# Patient Record
Sex: Male | Born: 1985 | Race: Black or African American | Hispanic: No | Marital: Single | Smoking: Former smoker
Health system: Southern US, Community
[De-identification: ages and names within clinical notes are randomized; demographics above are authoritative.]

## PROBLEM LIST (undated history)

## (undated) DIAGNOSIS — G35D Multiple sclerosis, unspecified: Secondary | ICD-10-CM

## (undated) DIAGNOSIS — G35 Multiple sclerosis: Secondary | ICD-10-CM

---

## 2012-07-14 ENCOUNTER — Encounter (HOSPITAL_COMMUNITY): Payer: Self-pay | Admitting: *Deleted

## 2012-07-14 ENCOUNTER — Emergency Department (HOSPITAL_COMMUNITY)
Admission: EM | Admit: 2012-07-14 | Discharge: 2012-07-14 | Disposition: A | Discharge status: Home / Self Care | Payer: Self-pay | Attending: Emergency Medicine | Admitting: Emergency Medicine

## 2012-07-14 DIAGNOSIS — S0100XA Unspecified open wound of scalp, initial encounter: Secondary | ICD-10-CM | POA: Insufficient documentation

## 2012-07-14 DIAGNOSIS — W208XXA Other cause of strike by thrown, projected or falling object, initial encounter: Secondary | ICD-10-CM | POA: Insufficient documentation

## 2012-07-14 DIAGNOSIS — S0101XA Laceration without foreign body of scalp, initial encounter: Secondary | ICD-10-CM

## 2012-07-14 DIAGNOSIS — Y9229 Other specified public building as the place of occurrence of the external cause: Secondary | ICD-10-CM | POA: Insufficient documentation
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
## 2012-07-14 NOTE — ED Notes (Signed)
Patient is alert and oriented x3.  He was given DC instructions and follow up visit instructions.  Patient gave verbal understanding.  He was DC ambulatory under his own power to home.  V/S stable.  He was not showing any signs of distress on DC 
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
## 2012-07-14 NOTE — ED Provider Notes (Signed)
Medical screening examination/treatment/procedure(s) were performed by non-physician practitioner and as supervising physician I was immediately available for consultation/collaboration.   Atheena Spano R Giovannie Scerbo, MD 07/14/12 0455 

## 2012-07-23 ENCOUNTER — Emergency Department (HOSPITAL_COMMUNITY)
Admission: EM | Admit: 2012-07-23 | Discharge: 2012-07-23 | Disposition: A | Discharge status: Home / Self Care | Payer: Self-pay | Attending: Emergency Medicine | Admitting: Emergency Medicine

## 2012-07-23 ENCOUNTER — Encounter (HOSPITAL_COMMUNITY): Payer: Self-pay | Admitting: Emergency Medicine

## 2012-07-23 DIAGNOSIS — B079 Viral wart, unspecified: Secondary | ICD-10-CM

## 2012-07-23 DIAGNOSIS — B078 Other viral warts: Secondary | ICD-10-CM | POA: Insufficient documentation

## 2012-07-23 DIAGNOSIS — F17200 Nicotine dependence, unspecified, uncomplicated: Secondary | ICD-10-CM | POA: Insufficient documentation

## 2012-07-23 DIAGNOSIS — Z4802 Encounter for removal of sutures: Secondary | ICD-10-CM | POA: Insufficient documentation

## 2012-07-23 NOTE — ED Notes (Signed)
Pt denies pain or swelling at staple site on top of head.. Area intact, no drainage noted

## 2012-07-23 NOTE — ED Provider Notes (Signed)
History     CSN: [id number]  Arrival date & time 07/23/12  0921   First MD Initiated Contact with Patient 07/23/12 [phone]      Chief Complaint  Patient presents with  . Suture / Staple Removal    one sutue due to laceration  . Skin Ulcer    l/elbow x 2 years    (Consider location/radiation/quality/duration/timing/severity/associated sxs/prior treatment) Patient is a 26 y.o. male presenting with suture removal. The history is provided by the patient.  Suture / Staple Removal  The sutures were placed 7 to 10 days ago. There has been no treatment since the wound repair.  Pt with laceration to the head 9 days ago. Repaired with a staple. Here for staple removal. No problems with the wound. No swelling, redness, drainage. No other complaints. Pt also complaining of a growth to the left elbow that has been there for "years" and states it is getting larger. Denies pain, redness, drainage out of it. Tried over the counter wart removal with no relief.  History reviewed. No pertinent past medical history.  History reviewed. No pertinent past surgical history.  No family history on file.  History  Substance Use Topics  . Smoking status: Current Everyday Smoker    Types: Cigarettes  . Smokeless tobacco: Not on file  . Alcohol Use: Yes      Review of Systems  Constitutional: Negative for fever and chills.  Respiratory: Negative.   Cardiovascular: Negative.   Gastrointestinal: Negative.   Skin: Positive for wound.    Allergies  Review of patient's allergies indicates no known allergies.  Home Medications  No current outpatient prescriptions on file.  BP 119/48  Pulse 63  Temp 98.2 F (36.8 C) (Oral)  Resp 18  SpO2 99%  Physical Exam  Nursing note and vitals reviewed. Constitutional: He is oriented to person, place, and time. He appears well-developed and well-nourished. No distress.  HENT:  Head: Normocephalic.       Healed laceration to the top left scalp about 1cm   In diamter  Eyes: Conjunctivae are normal. Pupils are equal, round, and reactive to light.  Neck: Neck supple.  Cardiovascular: Normal rate, regular rhythm and normal heart sounds.   Pulmonary/Chest: Effort normal and breath sounds normal. No respiratory distress. He has no wheezes. He has no rales.  Neurological: He is alert and oriented to person, place, and time.  Skin: Skin is warm and dry.       A 1cm, pedunculated skin wart to the left elbow, non tender, does not appear infected    ED Course  Procedures (including critical care time)  Staple removed by a nurse before i saw the pt. Laceration healed with no complications. Wart to the left elbow. Advised to follow up with pcp or dermatology for this cosmetic procedure.   1. Removal of staple   2. Cutaneous wart       MDM          Lottie Mussel, PA 07/23/12 1032

## 2012-07-25 NOTE — ED Provider Notes (Signed)
Medical screening examination/treatment/procedure(s) were performed by non-physician practitioner and as supervising physician I was immediately available for consultation/collaboration.  Maeghan Canny T Hanifa Antonetti, MD 07/25/12 0746 

## 2012-09-21 ENCOUNTER — Encounter (HOSPITAL_COMMUNITY): Payer: Self-pay | Admitting: Emergency Medicine

## 2012-09-21 ENCOUNTER — Emergency Department (HOSPITAL_COMMUNITY): Payer: No Typology Code available for payment source

## 2012-09-21 ENCOUNTER — Emergency Department (HOSPITAL_COMMUNITY)
Admission: EM | Admit: 2012-09-21 | Discharge: 2012-09-21 | Disposition: A | Discharge status: Home / Self Care | Payer: No Typology Code available for payment source | Attending: Emergency Medicine | Admitting: Emergency Medicine

## 2012-09-21 DIAGNOSIS — M7989 Other specified soft tissue disorders: Secondary | ICD-10-CM | POA: Insufficient documentation

## 2012-09-21 DIAGNOSIS — M79642 Pain in left hand: Secondary | ICD-10-CM

## 2012-09-21 DIAGNOSIS — IMO0002 Reserved for concepts with insufficient information to code with codable children: Secondary | ICD-10-CM | POA: Insufficient documentation

## 2012-09-21 DIAGNOSIS — F17200 Nicotine dependence, unspecified, uncomplicated: Secondary | ICD-10-CM | POA: Insufficient documentation

## 2012-09-21 DIAGNOSIS — M79609 Pain in unspecified limb: Secondary | ICD-10-CM | POA: Insufficient documentation

## 2012-09-21 DIAGNOSIS — M25569 Pain in unspecified knee: Secondary | ICD-10-CM

## 2012-09-21 DIAGNOSIS — T148XXA Other injury of unspecified body region, initial encounter: Secondary | ICD-10-CM

## 2012-09-21 NOTE — ED Notes (Signed)
[employer] at the bedside talking with pt.
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
## 2012-09-21 NOTE — ED Notes (Signed)
Ortho paged. 
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
## 2012-09-21 NOTE — Progress Notes (Signed)
Orthopedic Tech Progress Note Patient Details:  Alvin Williams 12-09-1986 [id number]  Ortho Devices Type of Ortho Device: Knee Sleeve Ortho Device/Splint Location: Right knee support Ortho Device/Splint Interventions: Application   [name] 09/21/2012, 9:08 AM

## 2012-09-21 NOTE — ED Provider Notes (Signed)
Medical screening examination/treatment/procedure(s) were performed by non-physician practitioner and as supervising physician I was immediately available for consultation/collaboration.  Jeanne Terrance R. Garvis Downum, MD 09/21/12 1610 
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
## 2012-09-21 NOTE — ED Notes (Signed)
CSI at the bedside 

## 2019-04-28 ENCOUNTER — Other Ambulatory Visit (HOSPITAL_COMMUNITY): Payer: Self-pay | Admitting: Neurology

## 2019-04-28 ENCOUNTER — Other Ambulatory Visit: Payer: Self-pay | Admitting: Neurology

## 2019-04-28 DIAGNOSIS — G35 Multiple sclerosis: Secondary | ICD-10-CM

## 2019-05-05 ENCOUNTER — Other Ambulatory Visit: Payer: Self-pay

## 2019-05-05 ENCOUNTER — Encounter (HOSPITAL_COMMUNITY)
Admission: RE | Admit: 2019-05-05 | Discharge: 2019-05-05 | Disposition: A | Discharge status: Home / Self Care | Payer: PRIVATE HEALTH INSURANCE | Source: Ambulatory Visit | Attending: Neurology | Admitting: Neurology

## 2019-05-05 ENCOUNTER — Encounter (HOSPITAL_COMMUNITY): Payer: Self-pay

## 2019-05-05 DIAGNOSIS — G35 Multiple sclerosis: Secondary | ICD-10-CM | POA: Insufficient documentation

## 2019-05-05 HISTORY — DX: Multiple sclerosis: G35

## 2019-05-05 HISTORY — DX: Multiple sclerosis, unspecified: G35.D

## 2019-05-05 MED ORDER — SODIUM CHLORIDE 0.9 % IV SOLN
Freq: Once | INTRAVENOUS | Status: AC
  Administered 2019-05-05: 09:00:00 via INTRAVENOUS

## 2019-05-05 MED ORDER — SODIUM CHLORIDE 0.9 % IV SOLN
1000.0000 mg | Freq: Once | INTRAVENOUS | Status: AC
  Administered 2019-05-05: 10:00:00 1000 mg via INTRAVENOUS
  Filled 2019-05-05: qty 8

## 2019-05-05 NOTE — Progress Notes (Signed)
First dose of 1 gm solumedrol given per order.  Patient tolerated procedure well.  Denies side effects as of 11:00.  Teaching completed related to medication and side effects.  Written material given.  Spoke to he and his mother via [place].  Verbalized understanding.  IV flushed and clamped for use in the am.

## 2019-05-05 NOTE — Discharge Instructions (Signed)
Methylprednisolone Solution for Injection  What is this medicine?  METHYLPREDNISOLONE (meth ill pred NISS oh lone) is a corticosteroid. It is commonly used to treat inflammation of the skin, joints, lungs, and other organs. Common conditions treated include asthma, allergies, and arthritis. It is also used for other conditions, such as blood disorders and diseases of the adrenal glands.  This medicine may be used for other purposes; ask your health care provider or pharmacist if you have questions.  COMMON BRAND NAME(S): A-Methapred, Solu-Medrol  What should I tell my health care provider before I take this medicine?  They need to know if you have any of these conditions:  -Cushing's syndrome  -eye disease, vision problems  -diabetes  -glaucoma  -heart disease  -high blood pressure  -infection (especially a virus infection such as chickenpox, cold sores, or herpes)  -liver disease  -mental illness  -myasthenia gravis  -osteoporosis  -recently received or scheduled to receive a vaccine  -seizures  -stomach or intestine problems  -thyroid disease  -an unusual or allergic reaction to lactose, methylprednisolone, other medicines, foods, dyes, or preservatives  -pregnant or trying to get pregnant  -breast-feeding  How should I use this medicine?  This medicine is for injection or infusion into a vein. It is also for injection into a muscle. It is given by a health care professional in a hospital or clinic setting.  Talk to your pediatrician regarding the use of this medicine in children. While this drug may be prescribed for selected conditions, precautions do apply.  Overdosage: If you think you have taken too much of this medicine contact a poison control center or emergency room at once.  NOTE: This medicine is only for you. Do not share this medicine with others.  What if I miss a dose?  This does not apply.  What may interact with this medicine?  Do not take this medicine with any of the following  medications:  -alefacept  -echinacea  -iopamidol  -live virus vaccines  -metyrapone  -mifepristone  This medicine may also interact with the following medications:  -amphotericin B  -aspirin and aspirin-like medicines  -certain antibiotics like erythromycin, clarithromycin, troleandomycin  -certain medicines for diabetes  -certain medicines for fungal infection like ketoconazole  -certain medicines for seizures like carbamazepine, phenobarbital, phenytoin  -certain medicines that treat or prevent blood clots like warfarin  -cyclosporine  -digoxin  -diuretics  -male hormones, like estrogens and birth control pills  -isoniazid  -NSAIDS, medicines for pain and inflammation, like ibuprofen or naproxen  -other medicines for myasthenia gravis  -rifampin  -vaccines  This list may not describe all possible interactions. Give your health care provider a list of all the medicines, herbs, non-prescription drugs, or dietary supplements you use. Also tell them if you smoke, drink alcohol, or use illegal drugs. Some items may interact with your medicine.  What should I watch for while using this medicine?  Tell your doctor or healthcare professional if your symptoms do not start to get better or if they get worse. Do not stop taking except on your doctor's advice. You may develop a severe reaction. Your doctor will tell you how much medicine to take.  Your condition will be monitored carefully while you are receiving this medicine.  This medicine may increase your risk of getting an infection. Tell your doctor or health care professional if you are around anyone with measles or chickenpox, or if you develop sores or blisters that do not heal properly.  This   medicine may affect blood sugar levels. If you have diabetes, check with your doctor or health care professional before you change your diet or the dose of your diabetic medicine.  Tell your doctor or health care professional right away if you have any change in your  eyesight.  Using this medicine for a long time may increase your risk of low bone mass. Talk to your doctor about bone health.  What side effects may I notice from receiving this medicine?  Side effects that you should report to your doctor or health care professional as soon as possible:  -allergic reactions like skin rash, itching or hives, swelling of the face, lips, or tongue  -bloody or tarry stools  -changes in vision  -hallucination, loss of contact with reality  -muscle cramps  -muscle pain  -palpitations  -signs and symptoms of high blood sugar such as dizziness; dry mouth; dry skin; fruity breath; nausea; stomach pain; increased hunger or thirst; increased urination  -signs and symptoms of infection like fever or chills; cough; sore throat; pain or trouble passing urine  -trouble passing urine or change in the amount of urine  Side effects that usually do not require medical attention (report to your doctor or health care professional if they continue or are bothersome):  -changes in emotions or mood  -constipation  -diarrhea  -excessive hair growth on the face or body  -headache  -nausea, vomiting  -pain, redness, or irritation at site where injected  -trouble sleeping  -weight gain  This list may not describe all possible side effects. Call your doctor for medical advice about side effects. You may report side effects to FDA at 1-800-FDA-1088.  Where should I keep my medicine?  This drug is given in a hospital or clinic and will not be stored at home.  NOTE: This sheet is a summary. It may not cover all possible information. If you have questions about this medicine, talk to your doctor, pharmacist, or health care provider.   2019 Elsevier/Gold Standard (2016-02-10 16:21:28)

## 2019-05-06 ENCOUNTER — Encounter (HOSPITAL_COMMUNITY): Payer: Self-pay

## 2019-05-06 ENCOUNTER — Encounter (HOSPITAL_COMMUNITY)
Admission: RE | Admit: 2019-05-06 | Discharge: 2019-05-06 | Disposition: A | Discharge status: Home / Self Care | Payer: PRIVATE HEALTH INSURANCE | Source: Ambulatory Visit | Attending: Neurology | Admitting: Neurology

## 2019-05-06 DIAGNOSIS — G35 Multiple sclerosis: Secondary | ICD-10-CM | POA: Diagnosis not present

## 2019-05-06 MED ORDER — SODIUM CHLORIDE 0.9 % IV SOLN
1000.0000 mg | Freq: Once | INTRAVENOUS | Status: AC
  Administered 2019-05-06: 1000 mg via INTRAVENOUS
  Filled 2019-05-06: qty 8

## 2019-05-06 MED ORDER — SODIUM CHLORIDE 0.9 % IV SOLN
Freq: Once | INTRAVENOUS | Status: AC
  Administered 2019-05-06: 09:00:00 via INTRAVENOUS

## 2019-05-06 MED ORDER — SODIUM CHLORIDE 0.9 % IV SOLN
1000.0000 mg | Freq: Once | INTRAVENOUS | Status: AC
  Administered 2019-05-07: 1000 mg via INTRAVENOUS
  Filled 2019-05-06: qty 8

## 2019-05-07 ENCOUNTER — Encounter (HOSPITAL_COMMUNITY)
Admission: RE | Admit: 2019-05-07 | Discharge: 2019-05-07 | Disposition: A | Discharge status: Home / Self Care | Payer: PRIVATE HEALTH INSURANCE | Source: Ambulatory Visit | Attending: Neurology | Admitting: Neurology

## 2019-05-07 ENCOUNTER — Encounter (HOSPITAL_COMMUNITY): Payer: Self-pay

## 2019-05-07 ENCOUNTER — Other Ambulatory Visit: Payer: Self-pay

## 2019-05-07 DIAGNOSIS — G35 Multiple sclerosis: Secondary | ICD-10-CM | POA: Diagnosis not present

## 2019-05-07 MED ORDER — SODIUM CHLORIDE 0.9 % IV SOLN
Freq: Once | INTRAVENOUS | Status: AC
  Administered 2019-05-07: 08:00:00 via INTRAVENOUS

## 2019-05-16 ENCOUNTER — Other Ambulatory Visit: Payer: Self-pay

## 2019-05-16 ENCOUNTER — Ambulatory Visit (HOSPITAL_COMMUNITY)
Admission: RE | Admit: 2019-05-16 | Discharge: 2019-05-16 | Disposition: A | Discharge status: Home / Self Care | Payer: No Typology Code available for payment source | Source: Ambulatory Visit | Attending: Neurology | Admitting: Neurology

## 2019-05-16 DIAGNOSIS — G35 Multiple sclerosis: Secondary | ICD-10-CM | POA: Diagnosis not present

## 2019-05-16 MED ORDER — GADOBUTROL 1 MMOL/ML IV SOLN
7.0000 mL | Freq: Once | INTRAVENOUS | Status: AC | PRN
  Administered 2019-05-16: 7 mL via INTRAVENOUS

## 2019-07-04 ENCOUNTER — Other Ambulatory Visit: Payer: Self-pay

## 2019-07-04 ENCOUNTER — Encounter (HOSPITAL_COMMUNITY)
Admission: RE | Admit: 2019-07-04 | Discharge: 2019-07-04 | Disposition: A | Discharge status: Home / Self Care | Payer: No Typology Code available for payment source | Source: Ambulatory Visit | Attending: Neurology | Admitting: Neurology

## 2019-07-04 ENCOUNTER — Encounter (HOSPITAL_COMMUNITY): Payer: Self-pay

## 2019-07-04 DIAGNOSIS — G35 Multiple sclerosis: Secondary | ICD-10-CM | POA: Insufficient documentation

## 2019-07-04 MED ORDER — SODIUM CHLORIDE 0.9 % IV SOLN
Freq: Once | INTRAVENOUS | Status: AC
  Administered 2019-07-04: 09:00:00 via INTRAVENOUS

## 2019-07-04 MED ORDER — SODIUM CHLORIDE 0.9 % IV SOLN
300.0000 mg | Freq: Once | INTRAVENOUS | Status: AC
  Administered 2019-07-04: 300 mg via INTRAVENOUS
  Filled 2019-07-04: qty 15

## 2019-08-01 ENCOUNTER — Encounter (HOSPITAL_COMMUNITY)
Admission: RE | Admit: 2019-08-01 | Discharge: 2019-08-01 | Disposition: A | Discharge status: Home / Self Care | Payer: No Typology Code available for payment source | Source: Ambulatory Visit | Attending: Neurology | Admitting: Neurology

## 2019-08-01 ENCOUNTER — Other Ambulatory Visit: Payer: Self-pay

## 2019-08-01 ENCOUNTER — Encounter (HOSPITAL_COMMUNITY): Payer: Self-pay

## 2019-08-01 DIAGNOSIS — G35 Multiple sclerosis: Secondary | ICD-10-CM | POA: Diagnosis not present

## 2019-08-01 MED ORDER — SODIUM CHLORIDE 0.9 % IV SOLN
300.0000 mg | INTRAVENOUS | Status: DC
Start: 2019-08-01 — End: 2019-08-02
  Administered 2019-08-01: 300 mg via INTRAVENOUS
  Filled 2019-08-01: qty 15

## 2019-08-01 MED ORDER — SODIUM CHLORIDE 0.9 % IV SOLN
Freq: Once | INTRAVENOUS | Status: AC
  Administered 2019-08-01: 250 mL via INTRAVENOUS

## 2019-08-29 ENCOUNTER — Encounter (HOSPITAL_COMMUNITY)
Admission: RE | Admit: 2019-08-29 | Discharge: 2019-08-29 | Disposition: A | Discharge status: Home / Self Care | Payer: No Typology Code available for payment source | Source: Ambulatory Visit | Attending: Neurology | Admitting: Neurology

## 2019-08-29 ENCOUNTER — Other Ambulatory Visit: Payer: Self-pay

## 2019-08-29 DIAGNOSIS — G35 Multiple sclerosis: Secondary | ICD-10-CM | POA: Insufficient documentation

## 2019-08-29 MED ORDER — SODIUM CHLORIDE 0.9 % IV SOLN
300.0000 mg | Freq: Once | INTRAVENOUS | Status: AC
  Administered 2019-08-29: 12:00:00 300 mg via INTRAVENOUS
  Filled 2019-08-29: qty 15

## 2019-08-29 MED ORDER — SODIUM CHLORIDE 0.9 % IV SOLN
Freq: Once | INTRAVENOUS | Status: AC
  Administered 2019-08-29: 12:00:00 via INTRAVENOUS

## 2019-09-26 ENCOUNTER — Encounter (HOSPITAL_COMMUNITY)
Admission: RE | Admit: 2019-09-26 | Discharge: 2019-09-26 | Disposition: A | Discharge status: Home / Self Care | Payer: No Typology Code available for payment source | Source: Ambulatory Visit | Attending: Neurology | Admitting: Neurology

## 2019-09-26 DIAGNOSIS — G35 Multiple sclerosis: Secondary | ICD-10-CM | POA: Insufficient documentation

## 2019-09-26 MED ORDER — SODIUM CHLORIDE 0.9 % IV SOLN
Freq: Once | INTRAVENOUS | Status: AC
  Administered 2019-09-26: 09:00:00 via INTRAVENOUS

## 2019-09-26 MED ORDER — SODIUM CHLORIDE 0.9 % IV SOLN
300.0000 mg | Freq: Once | INTRAVENOUS | Status: AC
  Administered 2019-09-26: 300 mg via INTRAVENOUS
  Filled 2019-09-26: qty 15

## 2019-10-24 ENCOUNTER — Encounter (HOSPITAL_COMMUNITY): Payer: Self-pay

## 2019-10-24 ENCOUNTER — Encounter (HOSPITAL_COMMUNITY)
Admission: RE | Admit: 2019-10-24 | Discharge: 2019-10-24 | Disposition: A | Discharge status: Home / Self Care | Payer: No Typology Code available for payment source | Source: Ambulatory Visit | Attending: Neurology | Admitting: Neurology

## 2019-10-24 ENCOUNTER — Other Ambulatory Visit: Payer: Self-pay

## 2019-10-24 DIAGNOSIS — G35 Multiple sclerosis: Secondary | ICD-10-CM | POA: Insufficient documentation

## 2019-10-24 MED ORDER — SODIUM CHLORIDE 0.9 % IV SOLN
Freq: Once | INTRAVENOUS | Status: AC
  Administered 2019-10-24: 10:00:00 via INTRAVENOUS

## 2019-10-24 MED ORDER — SODIUM CHLORIDE 0.9 % IV SOLN
300.0000 mg | Freq: Once | INTRAVENOUS | Status: AC
  Administered 2019-10-24: 300 mg via INTRAVENOUS
  Filled 2019-10-24: qty 15

## 2019-11-21 ENCOUNTER — Other Ambulatory Visit: Payer: Self-pay

## 2019-11-21 ENCOUNTER — Encounter (HOSPITAL_COMMUNITY)
Admission: RE | Admit: 2019-11-21 | Discharge: 2019-11-21 | Disposition: A | Discharge status: Home / Self Care | Payer: No Typology Code available for payment source | Source: Ambulatory Visit | Attending: Neurology | Admitting: Neurology

## 2019-11-21 ENCOUNTER — Encounter (HOSPITAL_COMMUNITY): Payer: Self-pay

## 2019-11-21 DIAGNOSIS — G35 Multiple sclerosis: Secondary | ICD-10-CM | POA: Insufficient documentation

## 2019-11-21 MED ORDER — SODIUM CHLORIDE 0.9 % IV SOLN
Freq: Once | INTRAVENOUS | Status: AC
  Administered 2019-11-21: 12:00:00 via INTRAVENOUS

## 2019-11-21 MED ORDER — SODIUM CHLORIDE 0.9 % IV SOLN
300.0000 mg | Freq: Once | INTRAVENOUS | Status: AC
  Administered 2019-11-21: 300 mg via INTRAVENOUS
  Filled 2019-11-21: qty 15

## 2019-12-15 ENCOUNTER — Encounter (HOSPITAL_COMMUNITY): Admission: RE | Admit: 2019-12-15 | Payer: No Typology Code available for payment source | Source: Ambulatory Visit

## 2019-12-22 ENCOUNTER — Encounter (HOSPITAL_COMMUNITY)
Admission: RE | Admit: 2019-12-22 | Discharge: 2019-12-22 | Disposition: A | Discharge status: Home / Self Care | Payer: No Typology Code available for payment source | Source: Ambulatory Visit | Attending: Neurology | Admitting: Neurology

## 2019-12-22 ENCOUNTER — Other Ambulatory Visit: Payer: Self-pay

## 2019-12-22 ENCOUNTER — Encounter (HOSPITAL_COMMUNITY): Payer: Self-pay

## 2019-12-22 DIAGNOSIS — G35 Multiple sclerosis: Secondary | ICD-10-CM | POA: Diagnosis present

## 2019-12-22 MED ORDER — SODIUM CHLORIDE 0.9 % IV SOLN
300.0000 mg | Freq: Once | INTRAVENOUS | Status: AC
  Administered 2019-12-22: 300 mg via INTRAVENOUS
  Filled 2019-12-22: qty 15

## 2019-12-22 MED ORDER — SODIUM CHLORIDE 0.9 % IV SOLN: Freq: Once | INTRAVENOUS | Status: AC

## 2020-01-07 ENCOUNTER — Other Ambulatory Visit: Payer: Self-pay

## 2020-01-07 ENCOUNTER — Ambulatory Visit: Payer: Self-pay | Attending: Internal Medicine

## 2020-01-07 DIAGNOSIS — Z20822 Contact with and (suspected) exposure to covid-19: Secondary | ICD-10-CM | POA: Insufficient documentation

## 2020-01-08 LAB — NOVEL CORONAVIRUS, NAA: SARS-CoV-2, NAA: NOT DETECTED

## 2020-01-20 ENCOUNTER — Encounter (HOSPITAL_COMMUNITY)
Admission: RE | Admit: 2020-01-20 | Discharge: 2020-01-20 | Disposition: A | Discharge status: Home / Self Care | Payer: No Typology Code available for payment source | Source: Ambulatory Visit | Attending: Neurology | Admitting: Neurology

## 2020-01-20 ENCOUNTER — Other Ambulatory Visit: Payer: Self-pay

## 2020-01-20 DIAGNOSIS — G35 Multiple sclerosis: Secondary | ICD-10-CM | POA: Insufficient documentation

## 2020-01-20 MED ORDER — SODIUM CHLORIDE 0.9 % IV SOLN: Freq: Once | INTRAVENOUS | Status: AC

## 2020-01-20 MED ORDER — SODIUM CHLORIDE 0.9 % IV SOLN
300.0000 mg | Freq: Once | INTRAVENOUS | Status: AC
  Administered 2020-01-20: 300 mg via INTRAVENOUS
  Filled 2020-01-20: qty 15

## 2020-02-17 ENCOUNTER — Encounter (HOSPITAL_COMMUNITY): Payer: Self-pay

## 2020-02-17 ENCOUNTER — Encounter (HOSPITAL_COMMUNITY)
Admission: RE | Admit: 2020-02-17 | Discharge: 2020-02-17 | Disposition: A | Discharge status: Home / Self Care | Payer: No Typology Code available for payment source | Source: Ambulatory Visit | Attending: Neurology | Admitting: Neurology

## 2020-02-17 ENCOUNTER — Other Ambulatory Visit: Payer: Self-pay

## 2020-02-17 DIAGNOSIS — G35 Multiple sclerosis: Secondary | ICD-10-CM | POA: Insufficient documentation

## 2020-02-17 MED ORDER — SODIUM CHLORIDE 0.9 % IV SOLN: Freq: Once | INTRAVENOUS | Status: AC

## 2020-02-17 MED ORDER — SODIUM CHLORIDE 0.9 % IV SOLN
300.0000 mg | Freq: Once | INTRAVENOUS | Status: AC
  Administered 2020-02-17: 12:00:00 300 mg via INTRAVENOUS
  Filled 2020-02-17: qty 15

## 2020-03-16 ENCOUNTER — Encounter (HOSPITAL_COMMUNITY)
Admission: RE | Admit: 2020-03-16 | Discharge: 2020-03-16 | Disposition: A | Discharge status: Home / Self Care | Payer: No Typology Code available for payment source | Source: Ambulatory Visit | Attending: Neurology | Admitting: Neurology

## 2020-03-16 ENCOUNTER — Other Ambulatory Visit: Payer: Self-pay

## 2020-03-16 ENCOUNTER — Encounter (HOSPITAL_COMMUNITY): Payer: Self-pay

## 2020-03-16 DIAGNOSIS — G35 Multiple sclerosis: Secondary | ICD-10-CM | POA: Diagnosis not present

## 2020-03-16 MED ORDER — SODIUM CHLORIDE 0.9 % IV SOLN: Freq: Once | INTRAVENOUS | Status: AC

## 2020-03-16 MED ORDER — SODIUM CHLORIDE 0.9 % IV SOLN
300.0000 mg | Freq: Once | INTRAVENOUS | Status: AC
  Administered 2020-03-16: 300 mg via INTRAVENOUS
  Filled 2020-03-16: qty 15

## 2020-04-13 ENCOUNTER — Other Ambulatory Visit: Payer: Self-pay

## 2020-04-13 ENCOUNTER — Encounter (HOSPITAL_COMMUNITY)
Admission: RE | Admit: 2020-04-13 | Discharge: 2020-04-13 | Disposition: A | Discharge status: Home / Self Care | Payer: BC Managed Care – PPO | Source: Ambulatory Visit | Attending: Neurology | Admitting: Neurology

## 2020-04-13 ENCOUNTER — Encounter (HOSPITAL_COMMUNITY): Payer: Self-pay

## 2020-04-13 DIAGNOSIS — G35 Multiple sclerosis: Secondary | ICD-10-CM | POA: Insufficient documentation

## 2020-04-13 MED ORDER — SODIUM CHLORIDE 0.9 % IV SOLN: Freq: Once | INTRAVENOUS | Status: AC

## 2020-04-13 MED ORDER — SODIUM CHLORIDE 0.9 % IV SOLN
300.0000 mg | Freq: Once | INTRAVENOUS | Status: AC
  Administered 2020-04-13: 09:00:00 300 mg via INTRAVENOUS
  Filled 2020-04-13: qty 15

## 2020-04-21 DIAGNOSIS — G8929 Other chronic pain: Secondary | ICD-10-CM | POA: Insufficient documentation

## 2020-04-21 DIAGNOSIS — R2681 Unsteadiness on feet: Secondary | ICD-10-CM | POA: Insufficient documentation

## 2020-05-11 ENCOUNTER — Other Ambulatory Visit: Payer: Self-pay

## 2020-05-11 ENCOUNTER — Encounter (HOSPITAL_COMMUNITY)
Admission: RE | Admit: 2020-05-11 | Discharge: 2020-05-11 | Disposition: A | Discharge status: Home / Self Care | Payer: BC Managed Care – PPO | Source: Ambulatory Visit | Attending: Neurology | Admitting: Neurology

## 2020-05-11 ENCOUNTER — Encounter (HOSPITAL_COMMUNITY): Payer: Self-pay

## 2020-05-11 DIAGNOSIS — G35 Multiple sclerosis: Secondary | ICD-10-CM | POA: Insufficient documentation

## 2020-05-11 MED ORDER — SODIUM CHLORIDE 0.9 % IV SOLN: Freq: Once | INTRAVENOUS | Status: AC

## 2020-05-11 MED ORDER — SODIUM CHLORIDE 0.9 % IV SOLN
300.0000 mg | Freq: Once | INTRAVENOUS | Status: AC
  Administered 2020-05-11: 300 mg via INTRAVENOUS
  Filled 2020-05-11: qty 15

## 2020-06-08 ENCOUNTER — Other Ambulatory Visit: Payer: Self-pay

## 2020-06-08 ENCOUNTER — Encounter (HOSPITAL_COMMUNITY)
Admission: RE | Admit: 2020-06-08 | Discharge: 2020-06-08 | Disposition: A | Discharge status: Home / Self Care | Payer: BC Managed Care – PPO | Source: Ambulatory Visit | Attending: Neurology | Admitting: Neurology

## 2020-06-08 DIAGNOSIS — G35 Multiple sclerosis: Secondary | ICD-10-CM | POA: Insufficient documentation

## 2020-06-08 MED ORDER — SODIUM CHLORIDE 0.9 % IV SOLN: Freq: Once | INTRAVENOUS | Status: AC

## 2020-06-08 MED ORDER — SODIUM CHLORIDE 0.9 % IV SOLN
300.0000 mg | Freq: Once | INTRAVENOUS | Status: AC
  Administered 2020-06-08: 300 mg via INTRAVENOUS
  Filled 2020-06-08: qty 15

## 2020-07-06 ENCOUNTER — Encounter (HOSPITAL_COMMUNITY): Payer: Self-pay

## 2020-07-06 ENCOUNTER — Other Ambulatory Visit: Payer: Self-pay

## 2020-07-06 ENCOUNTER — Encounter (HOSPITAL_COMMUNITY)
Admission: RE | Admit: 2020-07-06 | Discharge: 2020-07-06 | Disposition: A | Discharge status: Home / Self Care | Payer: BC Managed Care – PPO | Source: Ambulatory Visit | Attending: Neurology | Admitting: Neurology

## 2020-07-06 DIAGNOSIS — G35 Multiple sclerosis: Secondary | ICD-10-CM | POA: Diagnosis present

## 2020-07-06 MED ORDER — SODIUM CHLORIDE 0.9 % IV SOLN: Freq: Once | INTRAVENOUS | Status: AC

## 2020-07-06 MED ORDER — SODIUM CHLORIDE 0.9 % IV SOLN
300.0000 mg | Freq: Once | INTRAVENOUS | Status: AC
  Administered 2020-07-06: 300 mg via INTRAVENOUS
  Filled 2020-07-06: qty 15

## 2020-08-03 ENCOUNTER — Encounter (HOSPITAL_COMMUNITY)
Admission: RE | Admit: 2020-08-03 | Discharge: 2020-08-03 | Disposition: A | Discharge status: Home / Self Care | Payer: BC Managed Care – PPO | Source: Ambulatory Visit | Attending: Neurology | Admitting: Neurology

## 2020-08-03 ENCOUNTER — Encounter (HOSPITAL_COMMUNITY): Payer: Self-pay

## 2020-08-03 ENCOUNTER — Other Ambulatory Visit: Payer: Self-pay

## 2020-08-03 DIAGNOSIS — G35 Multiple sclerosis: Secondary | ICD-10-CM | POA: Diagnosis present

## 2020-08-03 MED ORDER — SODIUM CHLORIDE 0.9 % IV SOLN
300.0000 mg | Freq: Once | INTRAVENOUS | Status: AC
  Administered 2020-08-03: 300 mg via INTRAVENOUS
  Filled 2020-08-03: qty 15

## 2020-08-03 MED ORDER — SODIUM CHLORIDE 0.9 % IV SOLN: Freq: Once | INTRAVENOUS | Status: AC

## 2020-08-26 ENCOUNTER — Ambulatory Visit (INDEPENDENT_AMBULATORY_CARE_PROVIDER_SITE_OTHER): Payer: BC Managed Care – PPO | Admitting: Otolaryngology

## 2020-08-31 ENCOUNTER — Other Ambulatory Visit: Payer: Self-pay

## 2020-08-31 ENCOUNTER — Encounter (HOSPITAL_COMMUNITY): Payer: Self-pay

## 2020-08-31 ENCOUNTER — Encounter (HOSPITAL_COMMUNITY)
Admission: RE | Admit: 2020-08-31 | Discharge: 2020-08-31 | Disposition: A | Discharge status: Home / Self Care | Payer: BC Managed Care – PPO | Source: Ambulatory Visit | Attending: Neurology | Admitting: Neurology

## 2020-08-31 DIAGNOSIS — G35 Multiple sclerosis: Secondary | ICD-10-CM | POA: Insufficient documentation

## 2020-08-31 MED ORDER — SODIUM CHLORIDE 0.9 % IV SOLN
300.0000 mg | Freq: Once | INTRAVENOUS | Status: AC
  Administered 2020-08-31: 300 mg via INTRAVENOUS
  Filled 2020-08-31: qty 15

## 2020-08-31 MED ORDER — SODIUM CHLORIDE 0.9 % IV SOLN: Freq: Once | INTRAVENOUS | Status: AC

## 2020-09-28 ENCOUNTER — Encounter (HOSPITAL_COMMUNITY): Admission: RE | Admit: 2020-09-28 | Payer: BC Managed Care – PPO | Source: Ambulatory Visit

## 2020-10-01 ENCOUNTER — Encounter (HOSPITAL_COMMUNITY)
Admission: RE | Admit: 2020-10-01 | Discharge: 2020-10-01 | Disposition: A | Discharge status: Home / Self Care | Payer: BC Managed Care – PPO | Source: Ambulatory Visit | Attending: Neurology | Admitting: Neurology

## 2020-10-01 ENCOUNTER — Other Ambulatory Visit: Payer: Self-pay

## 2020-10-01 ENCOUNTER — Encounter (HOSPITAL_COMMUNITY): Payer: Self-pay

## 2020-10-01 DIAGNOSIS — G35 Multiple sclerosis: Secondary | ICD-10-CM | POA: Diagnosis not present

## 2020-10-01 MED ORDER — SODIUM CHLORIDE 0.9 % IV SOLN: Freq: Once | INTRAVENOUS | Status: AC

## 2020-10-01 MED ORDER — SODIUM CHLORIDE 0.9 % IV SOLN
300.0000 mg | Freq: Once | INTRAVENOUS | Status: AC
  Administered 2020-10-01: 300 mg via INTRAVENOUS
  Filled 2020-10-01: qty 15

## 2020-10-29 ENCOUNTER — Encounter (HOSPITAL_COMMUNITY): Payer: Self-pay

## 2020-10-29 ENCOUNTER — Encounter (HOSPITAL_COMMUNITY)
Admission: RE | Admit: 2020-10-29 | Discharge: 2020-10-29 | Disposition: A | Discharge status: Home / Self Care | Payer: BC Managed Care – PPO | Source: Ambulatory Visit | Attending: Neurology | Admitting: Neurology

## 2020-10-29 ENCOUNTER — Other Ambulatory Visit: Payer: Self-pay

## 2020-10-29 DIAGNOSIS — G35 Multiple sclerosis: Secondary | ICD-10-CM | POA: Diagnosis not present

## 2020-10-29 MED ORDER — SODIUM CHLORIDE 0.9 % IV SOLN
300.0000 mg | Freq: Once | INTRAVENOUS | Status: AC
  Administered 2020-10-29: 300 mg via INTRAVENOUS
  Filled 2020-10-29: qty 15

## 2020-10-29 MED ORDER — SODIUM CHLORIDE 0.9 % IV SOLN: Freq: Once | INTRAVENOUS | Status: AC

## 2020-11-29 ENCOUNTER — Encounter (HOSPITAL_COMMUNITY): Payer: Self-pay

## 2020-11-29 ENCOUNTER — Encounter (HOSPITAL_COMMUNITY)
Admission: RE | Admit: 2020-11-29 | Discharge: 2020-11-29 | Disposition: A | Discharge status: Home / Self Care | Payer: BC Managed Care – PPO | Source: Ambulatory Visit | Attending: Neurology | Admitting: Neurology

## 2020-11-29 ENCOUNTER — Other Ambulatory Visit: Payer: Self-pay

## 2020-11-29 DIAGNOSIS — G35 Multiple sclerosis: Secondary | ICD-10-CM | POA: Insufficient documentation

## 2020-11-29 MED ORDER — SODIUM CHLORIDE 0.9 % IV SOLN: Freq: Once | INTRAVENOUS | Status: AC

## 2020-11-29 MED ORDER — SODIUM CHLORIDE 0.9 % IV SOLN
300.0000 mg | Freq: Once | INTRAVENOUS | Status: AC
  Administered 2020-11-29: 300 mg via INTRAVENOUS
  Filled 2020-11-29: qty 15

## 2020-12-27 ENCOUNTER — Encounter (HOSPITAL_COMMUNITY): Payer: Self-pay

## 2020-12-27 ENCOUNTER — Encounter (HOSPITAL_COMMUNITY)
Admission: RE | Admit: 2020-12-27 | Discharge: 2020-12-27 | Disposition: A | Discharge status: Home / Self Care | Payer: BC Managed Care – PPO | Source: Ambulatory Visit | Attending: Neurology | Admitting: Neurology

## 2020-12-27 ENCOUNTER — Other Ambulatory Visit: Payer: Self-pay

## 2020-12-27 DIAGNOSIS — G35 Multiple sclerosis: Secondary | ICD-10-CM | POA: Diagnosis present

## 2020-12-27 MED ORDER — SODIUM CHLORIDE 0.9 % IV SOLN: Freq: Once | INTRAVENOUS | Status: AC

## 2020-12-27 MED ORDER — SODIUM CHLORIDE 0.9 % IV SOLN
300.0000 mg | Freq: Once | INTRAVENOUS | Status: AC
  Administered 2020-12-27: 300 mg via INTRAVENOUS
  Filled 2020-12-27: qty 15

## 2021-01-13 ENCOUNTER — Other Ambulatory Visit (HOSPITAL_COMMUNITY): Payer: Self-pay | Admitting: Neurology

## 2021-01-13 ENCOUNTER — Other Ambulatory Visit: Payer: Self-pay | Admitting: Neurology

## 2021-01-13 DIAGNOSIS — G35 Multiple sclerosis: Secondary | ICD-10-CM

## 2021-01-24 ENCOUNTER — Encounter (HOSPITAL_COMMUNITY): Admission: RE | Admit: 2021-01-24 | Payer: BC Managed Care – PPO | Source: Ambulatory Visit

## 2021-01-25 ENCOUNTER — Encounter (HOSPITAL_COMMUNITY)
Admission: RE | Admit: 2021-01-25 | Discharge: 2021-01-25 | Disposition: A | Discharge status: Home / Self Care | Payer: BC Managed Care – PPO | Source: Ambulatory Visit | Attending: Neurology | Admitting: Neurology

## 2021-01-25 ENCOUNTER — Encounter (HOSPITAL_COMMUNITY): Payer: Self-pay

## 2021-01-25 ENCOUNTER — Other Ambulatory Visit: Payer: Self-pay

## 2021-01-25 DIAGNOSIS — G35 Multiple sclerosis: Secondary | ICD-10-CM | POA: Diagnosis present

## 2021-01-25 MED ORDER — SODIUM CHLORIDE 0.9 % IV SOLN: Freq: Once | INTRAVENOUS | Status: AC

## 2021-01-25 MED ORDER — SODIUM CHLORIDE 0.9 % IV SOLN
300.0000 mg | Freq: Once | INTRAVENOUS | Status: AC
  Administered 2021-01-25: 300 mg via INTRAVENOUS
  Filled 2021-01-25: qty 15

## 2021-01-27 ENCOUNTER — Ambulatory Visit (HOSPITAL_COMMUNITY): Payer: BC Managed Care – PPO | Attending: Neurology

## 2021-01-27 ENCOUNTER — Encounter (HOSPITAL_COMMUNITY): Payer: Self-pay

## 2021-01-27 ENCOUNTER — Ambulatory Visit (HOSPITAL_COMMUNITY): Payer: BC Managed Care – PPO

## 2021-02-22 ENCOUNTER — Encounter (HOSPITAL_COMMUNITY)
Admission: RE | Admit: 2021-02-22 | Discharge: 2021-02-22 | Disposition: A | Discharge status: Home / Self Care | Payer: BC Managed Care – PPO | Source: Ambulatory Visit | Attending: Neurology | Admitting: Neurology

## 2021-02-22 ENCOUNTER — Encounter (HOSPITAL_COMMUNITY): Payer: Self-pay

## 2021-02-22 ENCOUNTER — Other Ambulatory Visit: Payer: Self-pay

## 2021-02-22 DIAGNOSIS — G35 Multiple sclerosis: Secondary | ICD-10-CM | POA: Insufficient documentation

## 2021-02-22 MED ORDER — SODIUM CHLORIDE 0.9 % IV SOLN
300.0000 mg | Freq: Once | INTRAVENOUS | Status: AC
  Administered 2021-02-22: 300 mg via INTRAVENOUS
  Filled 2021-02-22: qty 15

## 2021-02-22 MED ORDER — SODIUM CHLORIDE 0.9 % IV SOLN: Freq: Once | INTRAVENOUS | Status: AC

## 2021-03-22 ENCOUNTER — Encounter (HOSPITAL_COMMUNITY): Admission: RE | Admit: 2021-03-22 | Payer: BC Managed Care – PPO | Source: Ambulatory Visit

## 2021-03-23 ENCOUNTER — Other Ambulatory Visit: Payer: Self-pay

## 2021-03-23 ENCOUNTER — Encounter (HOSPITAL_COMMUNITY)
Admission: RE | Admit: 2021-03-23 | Discharge: 2021-03-23 | Disposition: A | Discharge status: Home / Self Care | Payer: BC Managed Care – PPO | Source: Ambulatory Visit | Attending: Neurology | Admitting: Neurology

## 2021-03-23 ENCOUNTER — Encounter (HOSPITAL_COMMUNITY): Payer: Self-pay

## 2021-03-23 DIAGNOSIS — G35 Multiple sclerosis: Secondary | ICD-10-CM | POA: Diagnosis present

## 2021-03-23 MED ORDER — SODIUM CHLORIDE 0.9 % IV SOLN
300.0000 mg | Freq: Once | INTRAVENOUS | Status: AC
  Administered 2021-03-23: 300 mg via INTRAVENOUS
  Filled 2021-03-23: qty 15

## 2021-03-23 MED ORDER — SODIUM CHLORIDE 0.9 % IV SOLN: Freq: Once | INTRAVENOUS | Status: AC

## 2021-04-20 ENCOUNTER — Other Ambulatory Visit: Payer: Self-pay

## 2021-04-20 ENCOUNTER — Encounter (HOSPITAL_COMMUNITY)
Admission: RE | Admit: 2021-04-20 | Discharge: 2021-04-20 | Disposition: A | Discharge status: Home / Self Care | Payer: BC Managed Care – PPO | Source: Ambulatory Visit | Attending: Neurology | Admitting: Neurology

## 2021-04-20 DIAGNOSIS — G35 Multiple sclerosis: Secondary | ICD-10-CM | POA: Insufficient documentation

## 2021-04-20 MED ORDER — SODIUM CHLORIDE 0.9 % IV SOLN
300.0000 mg | Freq: Once | INTRAVENOUS | Status: AC
  Administered 2021-04-20: 300 mg via INTRAVENOUS
  Filled 2021-04-20: qty 15

## 2021-04-20 MED ORDER — SODIUM CHLORIDE 0.9 % IV SOLN: INTRAVENOUS | Status: DC

## 2021-05-18 ENCOUNTER — Encounter (HOSPITAL_COMMUNITY)
Admission: RE | Admit: 2021-05-18 | Discharge: 2021-05-18 | Disposition: A | Discharge status: Home / Self Care | Payer: BC Managed Care – PPO | Source: Ambulatory Visit | Attending: Neurology | Admitting: Neurology

## 2021-05-18 ENCOUNTER — Other Ambulatory Visit: Payer: Self-pay

## 2021-05-18 DIAGNOSIS — G35 Multiple sclerosis: Secondary | ICD-10-CM | POA: Insufficient documentation

## 2021-05-18 MED ORDER — SODIUM CHLORIDE 0.9 % IV SOLN
Freq: Once | INTRAVENOUS | Status: AC
  Administered 2021-05-18: 250 mL via INTRAVENOUS

## 2021-05-18 MED ORDER — SODIUM CHLORIDE 0.9 % IV SOLN
300.0000 mg | Freq: Once | INTRAVENOUS | Status: AC
  Administered 2021-05-18: 300 mg via INTRAVENOUS
  Filled 2021-05-18: qty 15

## 2021-06-15 ENCOUNTER — Encounter (HOSPITAL_COMMUNITY)
Admission: RE | Admit: 2021-06-15 | Discharge: 2021-06-15 | Disposition: A | Discharge status: Home / Self Care | Payer: BC Managed Care – PPO | Source: Ambulatory Visit | Attending: Neurology | Admitting: Neurology

## 2021-06-23 ENCOUNTER — Encounter (HOSPITAL_COMMUNITY): Admission: RE | Admit: 2021-06-23 | Payer: BC Managed Care – PPO | Source: Ambulatory Visit

## 2021-06-28 ENCOUNTER — Other Ambulatory Visit: Payer: Self-pay

## 2021-06-28 ENCOUNTER — Encounter (HOSPITAL_COMMUNITY)
Admission: RE | Admit: 2021-06-28 | Discharge: 2021-06-28 | Disposition: A | Discharge status: Home / Self Care | Payer: BC Managed Care – PPO | Source: Ambulatory Visit | Attending: Neurology | Admitting: Neurology

## 2021-06-28 DIAGNOSIS — G35 Multiple sclerosis: Secondary | ICD-10-CM | POA: Diagnosis present

## 2021-06-28 MED ORDER — SODIUM CHLORIDE 0.9 % IV SOLN
300.0000 mg | Freq: Once | INTRAVENOUS | Status: AC
  Administered 2021-06-28: 300 mg via INTRAVENOUS
  Filled 2021-06-28: qty 15

## 2021-06-28 MED ORDER — SODIUM CHLORIDE 0.9 % IV SOLN: INTRAVENOUS | Status: DC

## 2021-07-04 ENCOUNTER — Other Ambulatory Visit: Payer: Self-pay

## 2021-07-04 ENCOUNTER — Emergency Department (HOSPITAL_COMMUNITY)
Admission: EM | Admit: 2021-07-04 | Discharge: 2021-07-04 | Disposition: A | Discharge status: Home / Self Care | Payer: BC Managed Care – PPO | Attending: Emergency Medicine | Admitting: Emergency Medicine

## 2021-07-04 ENCOUNTER — Encounter (HOSPITAL_COMMUNITY): Payer: Self-pay

## 2021-07-04 DIAGNOSIS — W1839XA Other fall on same level, initial encounter: Secondary | ICD-10-CM | POA: Diagnosis not present

## 2021-07-04 DIAGNOSIS — R55 Syncope and collapse: Secondary | ICD-10-CM | POA: Diagnosis not present

## 2021-07-04 DIAGNOSIS — S76302A Unspecified injury of muscle, fascia and tendon of the posterior muscle group at thigh level, left thigh, initial encounter: Secondary | ICD-10-CM | POA: Insufficient documentation

## 2021-07-04 DIAGNOSIS — F17210 Nicotine dependence, cigarettes, uncomplicated: Secondary | ICD-10-CM | POA: Insufficient documentation

## 2021-07-04 MED ORDER — KETOROLAC TROMETHAMINE 60 MG/2ML IM SOLN
60.0000 mg | Freq: Once | INTRAMUSCULAR | Status: AC
  Administered 2021-07-04: 60 mg via INTRAMUSCULAR
  Filled 2021-07-04: qty 2

## 2021-07-04 NOTE — ED Provider Notes (Signed)
[facility] EMERGENCY DEPARTMENT Provider Note   CSN: [id number] Arrival date & time: 07/04/21  0515     History Chief Complaint  Patient presents with   Loss of Consciousness    Alvin Williams is a 35 y.o. male.  Patient states he felt dehydrated with dry mouth and lightheadedness on Saturday.  He had a syncopal/near syncopal episode.  Once he came around his left posterior thigh hurt.  States he thought was related to dehydration and possibly an injury with a fall.  Yesterday his girlfriend massaged it and he rested it with ice.  He states that he had pretty good improvement but still hurt this morning to have milk he go to work so presents here for further evaluation.  Patient states he also hurt his left lateral hip when he fell in his right elbow but does not think these are serious injuries.  No head trauma.  No other associated symptoms.   Loss of Consciousness     Past Medical History:  Diagnosis Date   MS (multiple sclerosis) (HCC)     There are no problems to display for this patient.   History reviewed. No pertinent surgical history.     History reviewed. No pertinent family history.  Social History   Tobacco Use   Smoking status: Every Day    Types: Cigarettes   Smokeless tobacco: Never  Vaping Use   Vaping Use: Never used  Substance Use Topics   Alcohol use: Yes   Drug use: Yes    Frequency: 5.0 times per week    Types: Marijuana    Home Medications Prior to Admission medications   Medication Sig Start Date End Date Taking? Authorizing Provider  gabapentin (NEURONTIN) 300 MG capsule Take 300 mg by mouth 2 (two) times daily.    [provider]  ibuprofen (ADVIL) 600 MG tablet Take 600 mg by mouth every 6 (six) hours as needed for moderate pain.    [provider]  natalizumab (TYSABRI) 300 MG/15ML injection Inject 300 mg into the vein every 30 (thirty) days.    Beryle Beams, MD    Allergies    Patient has no known  allergies.  Review of Systems   Review of Systems  Cardiovascular:  Positive for syncope.  All other systems reviewed and are negative.  Physical Exam Updated Vital Signs BP 113/84 (BP Location: Left Arm)   Pulse 83   Temp 98.4 F (36.9 C) (Oral)   Resp 18   Ht 5\' 10"  (1.778 m)   Wt 72.6 kg   SpO2 98%   BMI 22.96 kg/m   Physical Exam Vitals and nursing note reviewed.  Constitutional:      Appearance: He is well-developed.  HENT:     Head: Normocephalic and atraumatic.     Mouth/Throat:     Mouth: Mucous membranes are moist.     Pharynx: Oropharynx is clear.  Eyes:     Pupils: Pupils are equal, round, and reactive to light.  Cardiovascular:     Rate and Rhythm: Normal rate.  Pulmonary:     Effort: Pulmonary effort is normal. No respiratory distress.  Abdominal:     General: Abdomen is flat. There is no distension.  Musculoskeletal:        General: Tenderness (Over left hamstring.  Also with pain when he pushes his heel into the bed.  Mild edema.  Pulses intact.) present. Normal range of motion.     Cervical back: Normal range of motion.  Skin:    General: Skin is warm and dry.  Neurological:     General: No focal deficit present.     Mental Status: He is alert.    ED Results / Procedures / Treatments   Labs (all labs ordered are listed, but only abnormal results are displayed) Labs Reviewed - No data to display  EKG None  Radiology No results found.  Procedures Procedures   Medications Ordered in ED Medications  ketorolac (TORADOL) injection 60 mg (has no administration in time range)    ED Course  I have reviewed the triage vital signs and the nursing notes.  Pertinent labs & imaging results that were available during my care of the patient were reviewed by me and considered in my medical decision making (see chart for details).    MDM Rules/Calculators/A&P                          Suspect hamstring injury related to the fall versus  dehydration.  Is already had significant provement.  Will offer crutches, Ace wrap and ice.  Ortho follow-up if not improving to evaluate for a more severe strain or possible surgical intervention.  Suspect he will be fine.  Work note provided.  Final Clinical Impression(s) / ED Diagnoses Final diagnoses:  Syncope, unspecified syncope type  Left hamstring injury, initial encounter    Rx / DC Orders ED Discharge Orders     None        Kail Fraley, Barbara Cower, MD 07/04/21 [phone]

## 2021-07-04 NOTE — ED Triage Notes (Addendum)
Pt here form home with cc of left leg pain from a syncopal episode yesterday. Said he was out in the heat, not eating or drinking resulting in him passing out. Landed on his leg. Thinks he just pulled a muscle

## 2021-07-26 ENCOUNTER — Encounter (HOSPITAL_COMMUNITY): Payer: Self-pay

## 2021-07-26 ENCOUNTER — Other Ambulatory Visit: Payer: Self-pay

## 2021-07-26 ENCOUNTER — Encounter (HOSPITAL_COMMUNITY)
Admission: RE | Admit: 2021-07-26 | Discharge: 2021-07-26 | Disposition: A | Discharge status: Home / Self Care | Payer: BC Managed Care – PPO | Source: Ambulatory Visit | Attending: Neurology | Admitting: Neurology

## 2021-07-26 DIAGNOSIS — G35 Multiple sclerosis: Secondary | ICD-10-CM | POA: Diagnosis present

## 2021-07-26 MED ORDER — SODIUM CHLORIDE 0.9 % IV SOLN
300.0000 mg | Freq: Once | INTRAVENOUS | Status: AC
  Administered 2021-07-26: 300 mg via INTRAVENOUS
  Filled 2021-07-26: qty 15

## 2021-07-26 MED ORDER — SODIUM CHLORIDE 0.9 % IV SOLN: INTRAVENOUS | Status: DC

## 2021-08-23 ENCOUNTER — Encounter (HOSPITAL_COMMUNITY)
Admission: RE | Admit: 2021-08-23 | Discharge: 2021-08-23 | Disposition: A | Discharge status: Home / Self Care | Payer: BC Managed Care – PPO | Source: Ambulatory Visit | Attending: Neurology | Admitting: Neurology

## 2021-08-23 DIAGNOSIS — G35 Multiple sclerosis: Secondary | ICD-10-CM | POA: Insufficient documentation

## 2021-08-26 NOTE — Addendum Note (Signed)
Encounter addended by: Ramiro Harvest, RN on: 08/26/2021 9:05 AM  Actions taken: Clinical Note Signed

## 2021-08-26 NOTE — Progress Notes (Signed)
08/26/2021 0841-Received fax from [employer] that they did not receive tysabri check list for appt on 08/23/21. Pt was a no show for that appt. Contacted [employer] and talked with Raynelle Fanning. Informed her that pt was a no show for that day and had not rescheduled his appt. Voiced understanding.

## 2021-09-08 ENCOUNTER — Encounter (HOSPITAL_COMMUNITY)
Admission: RE | Admit: 2021-09-08 | Discharge: 2021-09-08 | Disposition: A | Discharge status: Home / Self Care | Payer: BC Managed Care – PPO | Source: Ambulatory Visit | Attending: Neurology | Admitting: Neurology

## 2021-09-08 ENCOUNTER — Other Ambulatory Visit: Payer: Self-pay

## 2021-09-08 ENCOUNTER — Encounter (HOSPITAL_COMMUNITY): Payer: Self-pay

## 2021-09-08 DIAGNOSIS — G35 Multiple sclerosis: Secondary | ICD-10-CM | POA: Diagnosis not present

## 2021-09-08 MED ORDER — SODIUM CHLORIDE 0.9 % IV SOLN
300.0000 mg | Freq: Once | INTRAVENOUS | Status: AC
  Administered 2021-09-08: 300 mg via INTRAVENOUS
  Filled 2021-09-08: qty 15

## 2021-09-08 MED ORDER — SODIUM CHLORIDE 0.9 % IV SOLN
Freq: Once | INTRAVENOUS | Status: AC
  Administered 2021-09-08: 250 mL via INTRAVENOUS

## 2021-09-20 ENCOUNTER — Encounter (HOSPITAL_COMMUNITY): Payer: BC Managed Care – PPO

## 2021-09-27 ENCOUNTER — Ambulatory Visit (HOSPITAL_COMMUNITY): Payer: BC Managed Care – PPO

## 2021-09-27 ENCOUNTER — Ambulatory Visit (HOSPITAL_COMMUNITY): Payer: BC Managed Care – PPO | Attending: Neurology

## 2021-10-06 ENCOUNTER — Encounter (HOSPITAL_COMMUNITY): Payer: Self-pay

## 2021-10-06 ENCOUNTER — Encounter (HOSPITAL_COMMUNITY)
Admission: RE | Admit: 2021-10-06 | Discharge: 2021-10-06 | Disposition: A | Discharge status: Home / Self Care | Payer: BC Managed Care – PPO | Source: Ambulatory Visit | Attending: Neurology | Admitting: Neurology

## 2021-10-06 ENCOUNTER — Other Ambulatory Visit: Payer: Self-pay

## 2021-10-06 DIAGNOSIS — G35 Multiple sclerosis: Secondary | ICD-10-CM | POA: Insufficient documentation

## 2021-10-06 MED ORDER — SODIUM CHLORIDE 0.9 % IV SOLN
300.0000 mg | Freq: Once | INTRAVENOUS | Status: AC
  Administered 2021-10-06: 300 mg via INTRAVENOUS
  Filled 2021-10-06: qty 15

## 2021-10-06 MED ORDER — SODIUM CHLORIDE 0.9 % IV SOLN: INTRAVENOUS | Status: DC

## 2021-10-20 ENCOUNTER — Ambulatory Visit (HOSPITAL_COMMUNITY)
Admission: RE | Admit: 2021-10-20 | Discharge: 2021-10-20 | Disposition: A | Discharge status: Home / Self Care | Payer: BC Managed Care – PPO | Source: Ambulatory Visit | Attending: Neurology | Admitting: Neurology

## 2021-10-20 ENCOUNTER — Other Ambulatory Visit: Payer: Self-pay

## 2021-10-20 DIAGNOSIS — G35 Multiple sclerosis: Secondary | ICD-10-CM

## 2021-10-20 MED ORDER — GADOBUTROL 1 MMOL/ML IV SOLN
10.0000 mL | Freq: Once | INTRAVENOUS | Status: AC | PRN
  Administered 2021-10-20: 10 mL via INTRAVENOUS

## 2021-11-03 ENCOUNTER — Encounter (HOSPITAL_COMMUNITY): Admission: RE | Admit: 2021-11-03 | Payer: BC Managed Care – PPO | Source: Ambulatory Visit

## 2021-11-17 ENCOUNTER — Other Ambulatory Visit: Payer: Self-pay

## 2021-11-17 ENCOUNTER — Encounter (HOSPITAL_COMMUNITY)
Admission: RE | Admit: 2021-11-17 | Discharge: 2021-11-17 | Disposition: A | Discharge status: Home / Self Care | Payer: BC Managed Care – PPO | Source: Ambulatory Visit | Attending: Neurology | Admitting: Neurology

## 2021-11-17 DIAGNOSIS — G35 Multiple sclerosis: Secondary | ICD-10-CM | POA: Diagnosis not present

## 2021-11-17 MED ORDER — SODIUM CHLORIDE 0.9 % IV SOLN
300.0000 mg | Freq: Once | INTRAVENOUS | Status: AC
  Administered 2021-11-17: 300 mg via INTRAVENOUS
  Filled 2021-11-17: qty 15

## 2021-11-17 MED ORDER — SODIUM CHLORIDE 0.9 % IV SOLN: INTRAVENOUS | Status: DC

## 2021-12-15 ENCOUNTER — Encounter (HOSPITAL_COMMUNITY): Admission: RE | Admit: 2021-12-15 | Payer: BC Managed Care – PPO | Source: Ambulatory Visit

## 2021-12-21 ENCOUNTER — Encounter (HOSPITAL_COMMUNITY): Payer: Self-pay

## 2021-12-21 ENCOUNTER — Encounter (HOSPITAL_COMMUNITY)
Admission: RE | Admit: 2021-12-21 | Discharge: 2021-12-21 | Disposition: A | Discharge status: Home / Self Care | Payer: BC Managed Care – PPO | Source: Ambulatory Visit | Attending: Neurology | Admitting: Neurology

## 2021-12-21 DIAGNOSIS — G35 Multiple sclerosis: Secondary | ICD-10-CM | POA: Diagnosis present

## 2021-12-21 MED ORDER — SODIUM CHLORIDE 0.9 % IV SOLN
300.0000 mg | Freq: Once | INTRAVENOUS | Status: AC
  Administered 2021-12-21: 300 mg via INTRAVENOUS
  Filled 2021-12-21: qty 15

## 2021-12-21 MED ORDER — SODIUM CHLORIDE 0.9 % IV SOLN
INTRAVENOUS | Status: DC
  Administered 2021-12-21: 250 mL via INTRAVENOUS

## 2022-01-18 ENCOUNTER — Encounter (HOSPITAL_COMMUNITY): Payer: BC Managed Care – PPO

## 2022-02-01 ENCOUNTER — Encounter (HOSPITAL_COMMUNITY)
Admission: RE | Admit: 2022-02-01 | Discharge: 2022-02-01 | Disposition: A | Discharge status: Home / Self Care | Payer: Self-pay | Source: Ambulatory Visit | Attending: Neurology | Admitting: Neurology

## 2022-02-01 ENCOUNTER — Encounter (HOSPITAL_COMMUNITY): Payer: Self-pay

## 2022-02-01 DIAGNOSIS — G35 Multiple sclerosis: Secondary | ICD-10-CM | POA: Insufficient documentation

## 2022-02-01 MED ORDER — SODIUM CHLORIDE 0.9 % IV SOLN
300.0000 mg | Freq: Once | INTRAVENOUS | Status: AC
  Administered 2022-02-01: 300 mg via INTRAVENOUS
  Filled 2022-02-01: qty 15

## 2022-02-01 MED ORDER — SODIUM CHLORIDE 0.9 % IV SOLN: Freq: Once | INTRAVENOUS | Status: AC

## 2022-02-01 NOTE — Progress Notes (Signed)
°  February 01, 2022  Patient: Alvin Williams  Date of Birth: [birth year]  Date of Visit: 02/01/2022    To Whom It May Concern:  Alvin Williams was seen and treated at our [facility] on 02/01/2022. Alvin Williams  may return to work on 02/02/22.  Sincerely,   R Aquilla Voiles RN

## 2022-02-15 ENCOUNTER — Other Ambulatory Visit: Payer: Self-pay

## 2022-02-15 ENCOUNTER — Encounter (HOSPITAL_COMMUNITY): Payer: Self-pay | Admitting: Emergency Medicine

## 2022-02-15 ENCOUNTER — Emergency Department (HOSPITAL_COMMUNITY): Payer: Self-pay

## 2022-02-15 ENCOUNTER — Emergency Department (HOSPITAL_COMMUNITY)
Admission: EM | Admit: 2022-02-15 | Discharge: 2022-02-15 | Disposition: A | Discharge status: Home / Self Care | Payer: Self-pay | Attending: Emergency Medicine | Admitting: Emergency Medicine

## 2022-02-15 DIAGNOSIS — R0789 Other chest pain: Secondary | ICD-10-CM

## 2022-02-15 DIAGNOSIS — R072 Precordial pain: Secondary | ICD-10-CM | POA: Insufficient documentation

## 2022-02-15 LAB — BASIC METABOLIC PANEL
Anion gap: 8 (ref 5–15)
BUN: 12 mg/dL (ref 6–20)
CO2: 28 mmol/L (ref 22–32)
Calcium: 9.7 mg/dL (ref 8.9–10.3)
Chloride: 100 mmol/L (ref 98–111)
Creatinine, Ser: 1 mg/dL (ref 0.61–1.24)
GFR, Estimated: >60 mL/min (ref >60)
Glucose, Bld: 76 mg/dL (ref 70–99)
Potassium: 4.2 mmol/L (ref 3.5–5.1)
Sodium: 136 mmol/L (ref 135–145)

## 2022-02-15 LAB — CBC
HCT: 40.9 % (ref 39.0–52.0)
Hemoglobin: 13.5 g/dL (ref 13.0–17.0)
MCH: 31.1 pg (ref 26.0–34.0)
MCHC: 33 g/dL (ref 30.0–36.0)
MCV: 94.2 fL (ref 80.0–100.0)
Platelets: 206 10*3/uL (ref 150–400)
RBC: 4.34 MIL/uL (ref 4.22–5.81)
RDW: 13.6 % (ref 11.5–15.5)
WBC: 6.7 10*3/uL (ref 4.0–10.5)
nRBC: 0.6 % — ABNORMAL HIGH (ref 0.0–0.2)

## 2022-02-15 LAB — APTT: aPTT: 27 seconds (ref 24–36)

## 2022-02-15 LAB — PROTIME-INR
INR: 0.9 (ref 0.8–1.2)
Prothrombin Time: 12.6 seconds (ref 11.4–15.2)

## 2022-02-15 LAB — TROPONIN I (HIGH SENSITIVITY): Troponin I (High Sensitivity): 2 ng/L (ref <18)

## 2022-02-15 LAB — CBG MONITORING, ED: Glucose-Capillary: 98 mg/dL (ref 70–99)

## 2022-02-15 MED ORDER — ASPIRIN 81 MG PO CHEW
324.0000 mg | CHEWABLE_TABLET | Freq: Once | ORAL | Status: AC
  Administered 2022-02-15: 324 mg via ORAL
  Filled 2022-02-15: qty 4

## 2022-02-15 MED ORDER — NAPROXEN 500 MG PO TABS: 500.0000 mg | ORAL_TABLET | Freq: Two times a day (BID) | ORAL | 0 refills | Status: DC

## 2022-02-15 MED ORDER — PANTOPRAZOLE SODIUM 20 MG PO TBEC: 20.0000 mg | DELAYED_RELEASE_TABLET | Freq: Every day | ORAL | 0 refills | Status: DC

## 2022-02-15 NOTE — ED Provider Notes (Signed)
[facility] EMERGENCY DEPARTMENT Provider Note   CSN: [id number] Arrival date & time: 02/15/22  0854     History  Chief Complaint  Patient presents with   Chest Pain    Alvin Williams is a 36 y.o. male.   Chest Pain  This patient is a 36 year old male, his primary medical history includes multiple sclerosis which she has had for several years and is currently undergoing infusions.  He reports that he has no history of cardiac disease nor is there a family history of cardiac disease at an early age.  In fact the patient exercises regularly several times a week, lots of strength training, very little cardio secondary to the pain that he has in his legs from his multiple sclerosis.  He states that overnight for approximately 6 hours he has had pain in his chest, it is in the middle of the chest over the sternum and seems to get worse with changes in position such as trying to sit up or get out of bed.  It has not improved to any significant degree and it is not associated with any shortness of breath fever or swelling of the legs.  He denies any radiation of this pain to the arms the shoulders the neck or the back and he has no associated nausea vomiting or diarrhea.  He denies drugs of abuse other than marijuana which she smokes multiple times per week  Home Medications Prior to Admission medications   Medication Sig Start Date End Date Taking? Authorizing Provider  naproxen (NAPROSYN) 500 MG tablet Take 1 tablet (500 mg total) by mouth 2 (two) times daily with a meal. 02/15/22  Yes Eber Hong, MD  pantoprazole (PROTONIX) 20 MG tablet Take 1 tablet (20 mg total) by mouth daily. 02/15/22 03/17/22 Yes Eber Hong, MD  gabapentin (NEURONTIN) 300 MG capsule Take 300 mg by mouth 2 (two) times daily.    [provider]  ibuprofen (ADVIL) 600 MG tablet Take 600 mg by mouth every 6 (six) hours as needed for moderate pain.    [provider]  natalizumab (TYSABRI) 300 MG/15ML  injection Inject 300 mg into the vein every 30 (thirty) days.    Beryle Beams, MD  Vitamin D, Ergocalciferol, (DRISDOL) 1.25 MG (50000 UNIT) CAPS capsule Take 50,000 Units by mouth every 7 (seven) days.    [provider]      Allergies    Patient has no known allergies.    Review of Systems   Review of Systems  Cardiovascular:  Positive for chest pain.   Physical Exam Updated Vital Signs BP (!) 94/57    Pulse 69    Temp 97.9 F (36.6 C) (Oral)    Resp 16    Ht 1.778 m (5\' 10" )    Wt 71.7 kg    SpO2 99%    BMI 22.68 kg/m  Physical Exam Vitals and nursing note reviewed.  Constitutional:      General: He is not in acute distress.    Appearance: He is well-developed.  HENT:     Head: Normocephalic and atraumatic.     Mouth/Throat:     Pharynx: No oropharyngeal exudate.  Eyes:     General: No scleral icterus.       Right eye: No discharge.        Left eye: No discharge.     Conjunctiva/sclera: Conjunctivae normal.     Pupils: Pupils are equal, round, and reactive to light.  Neck:  Thyroid: No thyromegaly.     Vascular: No JVD.  Cardiovascular:     Rate and Rhythm: Normal rate and regular rhythm.     Heart sounds: Normal heart sounds. No murmur heard.   No friction rub. No gallop.  Pulmonary:     Effort: Pulmonary effort is normal. No respiratory distress.     Breath sounds: Normal breath sounds. No wheezing or rales.  Chest:     Chest wall: Tenderness present.  Abdominal:     General: Bowel sounds are normal. There is no distension.     Palpations: Abdomen is soft. There is no mass.     Tenderness: There is no abdominal tenderness.  Musculoskeletal:        General: No tenderness. Normal range of motion.     Cervical back: Normal range of motion and neck supple.     Right lower leg: No edema.     Left lower leg: No edema.  Lymphadenopathy:     Cervical: No cervical adenopathy.  Skin:    General: Skin is warm and dry.     Findings: No erythema or rash.   Neurological:     Mental Status: He is alert.     Coordination: Coordination normal.  Psychiatric:        Behavior: Behavior normal.    ED Results / Procedures / Treatments   Labs (all labs ordered are listed, but only abnormal results are displayed) Labs Reviewed  CBC - Abnormal; Notable for the following components:      Result Value   nRBC 0.6 (*)    All other components within normal limits  BASIC METABOLIC PANEL  APTT  PROTIME-INR  CBG MONITORING, ED  TROPONIN I (HIGH SENSITIVITY)    EKG EKG Interpretation  Date/Time:  Wednesday February 15 2022 09:24:02 EST Ventricular Rate:  74 PR Interval:  213 QRS Duration: 85 QT Interval:  350 QTC Calculation: 389 R Axis:   86 Text Interpretation: Sinus rhythm Prolonged PR interval ST elevation suggests acute pericarditis since last tracing, ST elevations have been seen on prior EKGs Confirmed by Eber Hong ([id number]) on 02/15/2022 9:29:47 AM  Radiology DG Chest 2 View  Result Date: 02/15/2022 CLINICAL DATA:  Chest pain EXAM: CHEST - 2 VIEW COMPARISON:  None. FINDINGS: The heart size and mediastinal contours are within normal limits. Both lungs are clear. No pleural effusion or pneumothorax. The visualized skeletal structures are unremarkable. IMPRESSION: No active cardiopulmonary disease. Electronically Signed   By: Guadlupe Spanish M.D.   On: 02/15/2022 09:43    Procedures Procedures    Medications Ordered in ED Medications  aspirin chewable tablet 324 mg (324 mg Oral Given 02/15/22 0350)    ED Course/ Medical Decision Making/ A&P                           Medical Decision Making Amount and/or Complexity of Data Reviewed Labs: ordered. Radiology: ordered.  Risk OTC drugs.   This patient presents to the ED for concern of chest pain, this involves an extensive number of treatment options, and is a complaint that carries with it a high risk of complications and morbidity.  The differential diagnosis includes cardiac  disease, pneumothorax, pneumomediastinum, pneumopericardium, pulmonary embolism seems less likely, chest wall pain   Co morbidities that complicate the patient evaluation  Multiple sclerosis and chronic pain   Additional history obtained:  Additional history obtained from electronic medical record External records from outside source  obtained and reviewed including the record of the patient's frequent infusions for his multiple sclerosis, there is no prior cardiac testing that has been done   Lab Tests:  I Ordered, and personally interpreted labs.  The pertinent results include:  normal troponin, normal metabolic panel and CBC all unremarkable, normal electrolytes glucose   Imaging Studies ordered:  I ordered imaging studies including 2 view chest x-ray I independently visualized and interpreted imaging which showed no signs of pneumothorax pneumomediastinum, pneumonia or cardiomegaly I agree with the radiologist interpretation   Cardiac Monitoring:  The patient was maintained on a cardiac monitor.  I personally viewed and interpreted the cardiac monitored which showed an underlying rhythm of: Normal sinus rhythm, no ST elevation or arrhythmia   Medicines ordered and prescription drug management:  I ordered medication including aspirin for chest pain Reevaluation of the patient after these medicines showed that the patient improved I have reviewed the patients home medicines and have made adjustments as needed   Test Considered:  CT scan of the chest though it seems much less likely that this would be a pulmonary embolism given the lack of hypoxia shortness of breath tachycardia or swelling of the legs, the patient is not in a prothrombotic state   Critical Interventions:  Evaluation for the cause of chest pain including cardiac causes   Problem List / ED Course:  The patient has had ongoing chest pain throughout the evening and a negative troponin, he is low risk  for acute coronary syndrome, I have counseled the patient on his use of marijuana, the patient is agreeable to return should symptoms worsen.  This could be an early case of pericarditis but there is no signs of pericardial effusion or tamponade clinically, his symptoms are minimal and he has been resting without any distress.  Will refer to cardiology as an outpatient.    Social Determinants of Health:  None           Final Clinical Impression(s) / ED Diagnoses Final diagnoses:  Midsternal chest pain    Rx / DC Orders ED Discharge Orders          Ordered    naproxen (NAPROSYN) 500 MG tablet  2 times daily with meals        02/15/22 1039    pantoprazole (PROTONIX) 20 MG tablet  Daily        02/15/22 1039              Eber Hong, MD 02/15/22 1040

## 2022-02-15 NOTE — Discharge Instructions (Signed)
Your testing today was very reassuring and showed no signs of heart attack, your x-ray was normal.  I would recommend that you stop smoking as this can cause increasing rates of heart disease and problems with your lungs as well.  That being said the pain today may just be related to your chest wall, sometimes the joints of the chest including the ribs and the breastbone or the muscles can be causing the pain.  Occasionally it will be caused by something called pericarditis, I have attached the reading instructions and I would encourage you to read through that.  I also want you to see the cardiologist within the week but if your symptoms are worsening I want you to come back to the emergency department immediately. ?

## 2022-02-15 NOTE — ED Triage Notes (Signed)
Pt to the ED with complaints of central non- radiating chest pain that began at 0300 this morning. ? ?Pt is unable to describe the pain and states "it just hurts" ?

## 2022-02-15 NOTE — ED Notes (Signed)
Pt in gown and on cardiac monitor.

## 2022-03-01 ENCOUNTER — Encounter (HOSPITAL_COMMUNITY)
Admission: RE | Admit: 2022-03-01 | Discharge: 2022-03-01 | Disposition: A | Discharge status: Home / Self Care | Payer: Self-pay | Source: Ambulatory Visit | Attending: Neurology | Admitting: Neurology

## 2022-03-01 ENCOUNTER — Encounter (HOSPITAL_COMMUNITY): Payer: Self-pay

## 2022-03-01 DIAGNOSIS — G35 Multiple sclerosis: Secondary | ICD-10-CM | POA: Insufficient documentation

## 2022-03-01 MED ORDER — SODIUM CHLORIDE 0.9 % IV SOLN: Freq: Once | INTRAVENOUS | Status: AC

## 2022-03-01 MED ORDER — SODIUM CHLORIDE 0.9 % IV SOLN
300.0000 mg | Freq: Once | INTRAVENOUS | Status: AC
  Administered 2022-03-01: 300 mg via INTRAVENOUS
  Filled 2022-03-01: qty 15

## 2022-03-29 ENCOUNTER — Encounter (HOSPITAL_COMMUNITY): Admission: RE | Admit: 2022-03-29 | Payer: Self-pay | Source: Ambulatory Visit

## 2022-03-29 ENCOUNTER — Encounter (HOSPITAL_COMMUNITY): Admission: RE | Admit: 2022-03-29 | Payer: No Typology Code available for payment source | Source: Ambulatory Visit

## 2022-04-04 ENCOUNTER — Encounter (HOSPITAL_COMMUNITY): Payer: Self-pay

## 2022-04-04 ENCOUNTER — Encounter (HOSPITAL_COMMUNITY)
Admission: RE | Admit: 2022-04-04 | Discharge: 2022-04-04 | Disposition: A | Discharge status: Home / Self Care | Payer: No Typology Code available for payment source | Source: Ambulatory Visit | Attending: Neurology | Admitting: Neurology

## 2022-04-04 DIAGNOSIS — G35 Multiple sclerosis: Secondary | ICD-10-CM | POA: Diagnosis present

## 2022-04-04 MED ORDER — SODIUM CHLORIDE 0.9 % IV SOLN: Freq: Once | INTRAVENOUS | Status: AC

## 2022-04-04 MED ORDER — SODIUM CHLORIDE 0.9 % IV SOLN
300.0000 mg | Freq: Once | INTRAVENOUS | Status: AC
  Administered 2022-04-04: 300 mg via INTRAVENOUS
  Filled 2022-04-04: qty 15

## 2022-06-21 DIAGNOSIS — G35 Multiple sclerosis: Secondary | ICD-10-CM | POA: Diagnosis not present

## 2022-06-21 DIAGNOSIS — R200 Anesthesia of skin: Secondary | ICD-10-CM | POA: Diagnosis not present

## 2022-06-21 DIAGNOSIS — Z79899 Other long term (current) drug therapy: Secondary | ICD-10-CM | POA: Diagnosis not present

## 2022-06-21 DIAGNOSIS — R270 Ataxia, unspecified: Secondary | ICD-10-CM | POA: Diagnosis not present

## 2022-06-21 DIAGNOSIS — R201 Hypoesthesia of skin: Secondary | ICD-10-CM | POA: Diagnosis not present

## 2022-07-11 ENCOUNTER — Other Ambulatory Visit: Payer: Self-pay

## 2022-07-11 DIAGNOSIS — R270 Ataxia, unspecified: Secondary | ICD-10-CM | POA: Diagnosis not present

## 2022-07-11 DIAGNOSIS — R200 Anesthesia of skin: Secondary | ICD-10-CM | POA: Diagnosis not present

## 2022-07-11 DIAGNOSIS — G35 Multiple sclerosis: Secondary | ICD-10-CM | POA: Diagnosis not present

## 2022-07-11 DIAGNOSIS — Z79899 Other long term (current) drug therapy: Secondary | ICD-10-CM | POA: Diagnosis not present

## 2022-07-11 DIAGNOSIS — R201 Hypoesthesia of skin: Secondary | ICD-10-CM | POA: Diagnosis not present

## 2022-07-12 ENCOUNTER — Telehealth: Payer: Self-pay | Admitting: Pharmacy Technician

## 2022-07-12 NOTE — Telephone Encounter (Addendum)
Auth Submission: no auth needed Payer: [employer] Medication & CPT/J Code(s) submitted: Solumedrol (Methylprednisolone) J2930 Route of submission (phone, fax, portal): PHONE Auth type: Buy/Bill Units/visits requested:  Reference number: [id number] Approval from: 07/12/22 to 08/12/22   [employer] [facility] PHONE: [phone] Id: [id number] Bin: [id number] Gr: [id number] PCN: [id number]   NOTE:  patient will receive Tysabri tx @ [facility]

## 2022-07-13 ENCOUNTER — Ambulatory Visit (INDEPENDENT_AMBULATORY_CARE_PROVIDER_SITE_OTHER): Payer: 59 | Admitting: *Deleted

## 2022-07-13 VITALS — BP 96/57 | HR 70 | Temp 98.7°F | Resp 18 | Ht 69.0 in | Wt 150.4 lb

## 2022-07-13 DIAGNOSIS — G35 Multiple sclerosis: Secondary | ICD-10-CM

## 2022-07-13 MED ORDER — SODIUM CHLORIDE 0.9 % IV SOLN
1000.0000 mg | Freq: Once | INTRAVENOUS | Status: AC
  Administered 2022-07-13: 1000 mg via INTRAVENOUS
  Filled 2022-07-13: qty 16

## 2022-07-13 NOTE — Progress Notes (Signed)
Diagnosis: Multiple Sclerosis  Provider:  Chilton Greathouse, MD  Procedure: Infusion  IV Type: Peripheral, IV Location: L Antecubital  Solumedrol (Methylprednisolone), Dose: 1000 mg  Infusion Start Time: 1106 am  Infusion Stop Time: 1206 pm  Post Infusion IV Care: Observation period completed and Peripheral IV Discontinued  Discharge: Condition: Good, Destination: Home . AVS provided to patient.   Performed by:  Forrest Moron, RN

## 2022-07-14 ENCOUNTER — Ambulatory Visit (INDEPENDENT_AMBULATORY_CARE_PROVIDER_SITE_OTHER): Payer: 59

## 2022-07-14 ENCOUNTER — Encounter: Payer: Self-pay | Admitting: Pulmonary Disease

## 2022-07-14 VITALS — BP 108/65 | HR 62 | Temp 98.4°F | Resp 18 | Ht 69.0 in | Wt 149.8 lb

## 2022-07-14 DIAGNOSIS — G35 Multiple sclerosis: Secondary | ICD-10-CM | POA: Diagnosis not present

## 2022-07-14 MED ORDER — SODIUM CHLORIDE 0.9 % IV SOLN
1000.0000 mg | Freq: Once | INTRAVENOUS | Status: AC
  Administered 2022-07-14: 1000 mg via INTRAVENOUS
  Filled 2022-07-14: qty 16

## 2022-07-14 NOTE — Progress Notes (Signed)
Diagnosis: Multiple Sclerosis  Provider:  Chilton Greathouse, MD  Procedure: Infusion  IV Type: Peripheral, IV Location: L Antecubital  Solumedrol (Methylprednisolone), Dose: 1000 mg  Infusion Start Time: 1006  Infusion Stop Time: 1110  Post Infusion IV Care: Peripheral IV Discontinued  Discharge: Condition: Good, Destination: Home . AVS provided to patient.   Performed by:  Adriana Mccallum, RN

## 2022-07-14 NOTE — Patient Instructions (Addendum)
Excuse from Work, [employer], or Physical Activity _____[name]_____________________________________ needs to be excused from: _X___ Work. ____ School. ____ Physical activity. This is effective for the following dates: ___7/27/23-8/1/23___________________________________. He or she may return to work or school, but should avoid physical activity or other activities from now until _______________. Activity restrictions include: ____ Lifting more than _________ lb / kg. ____ Sitting longer than __________ minutes at a time. ____ Standing longer than ________ minutes at a time. ____ Other activities including: ___________________________________________________________________________________ _X___ He or she may return to full physical activity on ___8/1/23_____________. Health care provider name (printed): ___Cathy Alston__________________________________________________ Health care provider (signature): _________________________________________________________ Date: ____7/28/23__________________________ This information is not intended to replace advice given to you by your health care provider. Make sure you discuss any questions you have with your health care provider. Document Revised: 08/24/2021 Document Reviewed: 08/24/2021 Elsevier Patient Education  2023 [employer].

## 2022-07-19 ENCOUNTER — Ambulatory Visit (INDEPENDENT_AMBULATORY_CARE_PROVIDER_SITE_OTHER): Payer: 59

## 2022-07-19 VITALS — BP 103/64 | HR 78 | Temp 98.2°F | Resp 16 | Ht 69.0 in | Wt 147.0 lb

## 2022-07-19 DIAGNOSIS — G35 Multiple sclerosis: Secondary | ICD-10-CM | POA: Diagnosis not present

## 2022-07-19 MED ORDER — SODIUM CHLORIDE 0.9 % IV SOLN
1000.0000 mg | Freq: Once | INTRAVENOUS | Status: AC
  Administered 2022-07-19: 1000 mg via INTRAVENOUS
  Filled 2022-07-19: qty 16

## 2022-07-19 NOTE — Progress Notes (Signed)
Diagnosis: Multiple Sclerosis  Provider:  Chilton Greathouse, MD  Procedure: Infusion  IV Type: Peripheral, IV Location: L Upper Arm  Solumedrol (Methylprednisolone), Dose: 1000 mg  Infusion Start Time: 0855  Infusion Stop Time: 0957  Post Infusion IV Care: Peripheral IV Discontinued  Discharge: Condition: Good, Destination: Home . AVS provided to patient.   Performed by:  Nat Math, RN

## 2022-07-20 ENCOUNTER — Ambulatory Visit: Payer: Self-pay

## 2022-07-24 ENCOUNTER — Telehealth: Payer: Self-pay | Admitting: Pharmacy Technician

## 2022-07-24 NOTE — Telephone Encounter (Signed)
Patient will need to give verbal consent with [employer] program Donnamarie Poag) to complete application process.  Patient is aware that he needs to call and complete process. Medication will not be shipped until patient speaks with quick start program.  Phone: [phone] Medication must be shiiped to [facility]. [facility]. [address] [place], [place] [address] Attn: Serafina Royals

## 2022-08-03 ENCOUNTER — Encounter (HOSPITAL_COMMUNITY)
Admission: RE | Admit: 2022-08-03 | Discharge: 2022-08-03 | Disposition: A | Discharge status: Home / Self Care | Payer: 59 | Source: Ambulatory Visit | Attending: Neurology | Admitting: Neurology

## 2022-08-03 ENCOUNTER — Other Ambulatory Visit: Payer: Self-pay

## 2022-08-03 ENCOUNTER — Encounter (HOSPITAL_COMMUNITY): Payer: Self-pay

## 2022-08-03 ENCOUNTER — Other Ambulatory Visit: Payer: Self-pay | Admitting: Pharmacy Technician

## 2022-08-03 ENCOUNTER — Encounter: Payer: Self-pay | Admitting: Pulmonary Disease

## 2022-08-03 DIAGNOSIS — G35 Multiple sclerosis: Secondary | ICD-10-CM

## 2022-08-03 MED ORDER — SODIUM CHLORIDE 0.9 % IV SOLN
300.0000 mg | INTRAVENOUS | Status: DC
  Administered 2022-08-03: 300 mg via INTRAVENOUS
  Filled 2022-08-03: qty 15

## 2022-08-03 NOTE — Progress Notes (Signed)
Diagnosis: Multiple Sclerosis  Provider:  Beryle Beams MD  Procedure: Infusion  IV Type: Peripheral, IV Location: L Upper Arm  Tysabri (Natalizumab), Dose: 300 mg  Infusion Start Time: 0901  Infusion Stop Time: 1005  Post Infusion IV Care: Patient declined observation and Peripheral IV Discontinued  Discharge: Condition: Good, Destination: Home . AVS provided to patient.   Performed by:  Marin Shutter, RN

## 2022-08-08 NOTE — Telephone Encounter (Signed)
Patient would like to receive treatments at [facility] instead of [facility]. (1st dose was given @ [facility]) Future medication will need to be ordered and delivered to [facility].

## 2022-08-14 ENCOUNTER — Telehealth: Payer: Self-pay | Admitting: Pharmacy Technician

## 2022-08-14 NOTE — Telephone Encounter (Signed)
Received x1 dose from quick start program @ [facility]. Patient will come to [facility] for future doses. Patient has not completed enrollment process for FREE DRUG program. Left v/m for patient to call [employer] and f/u with enrollment process. Next scheduled appt 09/14/22 Awaiting call back. [facility] [phone]

## 2022-08-25 ENCOUNTER — Encounter: Payer: Self-pay | Admitting: Pulmonary Disease

## 2022-08-30 NOTE — Telephone Encounter (Addendum)
F/u:  Patient has been approved for the Tysabri co-pay card.  Id: [id number] Gr: [id number] Bin: [id number] Pcn: [id number] Approval date: 08/31/22 Max amount: 10,000/yr Phone: [phone] Fax: [phone]  Auth Submission: APPROVED Payer: Candise Bowens Medication & CPT/J Code(s) submitted: Tysabri (Natalizumab) S2395 Route of submission (phone, fax, portal):  Phone #[phone] Fax # [phone] Auth type: Buy/Bill Units/visits requested: 300mg  q6wks Reference number: DATE: 09/01/22 - 09/01/23

## 2022-09-08 ENCOUNTER — Encounter: Payer: Self-pay | Admitting: Pulmonary Disease

## 2022-09-13 ENCOUNTER — Other Ambulatory Visit (HOSPITAL_COMMUNITY): Payer: Self-pay | Admitting: Neurology

## 2022-09-13 ENCOUNTER — Other Ambulatory Visit: Payer: Self-pay | Admitting: Neurology

## 2022-09-13 DIAGNOSIS — G35 Multiple sclerosis: Secondary | ICD-10-CM | POA: Diagnosis not present

## 2022-09-13 DIAGNOSIS — R270 Ataxia, unspecified: Secondary | ICD-10-CM | POA: Diagnosis not present

## 2022-09-13 DIAGNOSIS — G43009 Migraine without aura, not intractable, without status migrainosus: Secondary | ICD-10-CM | POA: Diagnosis not present

## 2022-09-13 DIAGNOSIS — R201 Hypoesthesia of skin: Secondary | ICD-10-CM | POA: Diagnosis not present

## 2022-09-14 ENCOUNTER — Encounter: Payer: Self-pay | Admitting: Pulmonary Disease

## 2022-09-14 ENCOUNTER — Ambulatory Visit: Payer: 59

## 2022-09-26 DIAGNOSIS — M25462 Effusion, left knee: Secondary | ICD-10-CM | POA: Diagnosis not present

## 2022-09-26 DIAGNOSIS — M25562 Pain in left knee: Secondary | ICD-10-CM | POA: Diagnosis not present

## 2022-09-26 DIAGNOSIS — M79605 Pain in left leg: Secondary | ICD-10-CM | POA: Diagnosis not present

## 2022-09-28 ENCOUNTER — Ambulatory Visit (HOSPITAL_COMMUNITY): Payer: 59 | Attending: Neurology

## 2022-09-28 ENCOUNTER — Ambulatory Visit (HOSPITAL_COMMUNITY): Payer: 59

## 2022-09-29 ENCOUNTER — Ambulatory Visit (INDEPENDENT_AMBULATORY_CARE_PROVIDER_SITE_OTHER): Payer: 59

## 2022-09-29 VITALS — BP 108/62 | HR 56 | Temp 98.5°F | Resp 18 | Ht 69.0 in | Wt 152.6 lb

## 2022-09-29 DIAGNOSIS — G35 Multiple sclerosis: Secondary | ICD-10-CM

## 2022-09-29 MED ORDER — LORATADINE 10 MG PO TABS
10.0000 mg | ORAL_TABLET | Freq: Once | ORAL | Status: AC
  Administered 2022-09-29: 10 mg via ORAL
  Filled 2022-09-29: qty 1

## 2022-09-29 MED ORDER — ACETAMINOPHEN 325 MG PO TABS
650.0000 mg | ORAL_TABLET | Freq: Once | ORAL | Status: AC
  Administered 2022-09-29: 650 mg via ORAL
  Filled 2022-09-29: qty 2

## 2022-09-29 MED ORDER — SODIUM CHLORIDE 0.9 % IV SOLN
300.0000 mg | Freq: Once | INTRAVENOUS | Status: AC
  Administered 2022-09-29: 300 mg via INTRAVENOUS
  Filled 2022-09-29: qty 15

## 2022-09-29 MED ORDER — SODIUM CHLORIDE 0.9 % IV SOLN: INTRAVENOUS | Status: DC

## 2022-09-29 NOTE — Progress Notes (Signed)
Diagnosis: Multiple Sclerosis  Provider:  Marshell Garfinkel MD  Procedure: Infusion  IV Type: Peripheral, IV Location: L Forearm  Tysabri (Natalizumab), Dose: 300 mg  Infusion Start Time: 8184  Infusion Stop Time: 0375  Post Infusion IV Care: Patient declined observation and Peripheral IV Discontinued  Discharge: Condition: Good, Destination: Home . AVS provided to patient.   Performed by:  Koren Shiver, RN

## 2022-10-10 ENCOUNTER — Ambulatory Visit: Payer: 59 | Admitting: Neurology

## 2022-10-15 NOTE — Progress Notes (Unsigned)
[facility]  PATIENT: Alvin Williams DOB: 14-Jul-1986  REFERRING DOCTOR OR PCP: Beryle Beams MD SOURCE: Patient, notes from Dr. Gerilyn Pilgrim, laboratory reports, MRI reports, MRI images personally reviewed.  _________________________________   HISTORICAL  CHIEF COMPLAINT:  Chief Complaint  Patient presents with   New Patient (Initial Visit)    Pt in room #2 and alone. Pt here today for his tysabri for MS. Pt states he has leg pain today.    HISTORY OF PRESENT ILLNESS:  I had the pleasure seeing your patient, Fateh Kindle, at the [facility] at [facility] for neurologic consultation regarding his multiple sclerosis.  He is a 36 year old man who was diagnosed with MS in 2020.  In 2019 he had an episode of numbness and pain in the legs x 2 weeks.  One month later, he had symptoms in the left hand x 1 month.   A couple months later, while at work he noted vision was blurry when he would turn his head for a few seconds at a time and his vision was jerking.     He was referred to Dr, Gerilyn Pilgrim and then had MRI's performed that were consistent with MS.  Marland Kitchen   He was placed on Tysabri and continues on it.    He has done well with no exacerbation.    He has been doing infusions at [facility].    Last infusion was 2 weeks ago.   By report, he is JCV positive and the Tysabri was stretched to every 6 weeks.  I do not have the titer.  Currently, he notes his gait is slightly off balanced.   He fell a couple months ago in the bathroom.  He as pain in both legs., left > right.  He notes some leg numbness.   He notes some spasticity in leg muscles.  He also notes milder arm pain and numbness   He will sometimes drop items.     Bladder function is fine.    He denies any issues with his vision currently.  He notes reduced memory and focus.  He has some depression and anxiety.   He sleeps only 5 hours most night due to repeated awakenings.     He notes fatigue.  He is  otherwise in good health.   He is not able to work out as much as he used to.    Imaging:  MRI of the brain 10/20/2021 showed multiple T2/FLAIR hyperintense foci in the cerebral hemispheres, predominantly in the periventricular white matter radially oriented to the ventricles.  Foci also noted in the right cerebellar hemisphere, and right posterior pons.    MRI of the cervical spine 10/20/2021 showed T2 hyperintense foci within the spinal cord adjacent to C1-C2, adjacent to C3 centrally and towards the left, adjacent to C3 posterolaterally to the right (small), adjacent to C4-C5 posterolaterally to the right  MRI of the brain 05/16/2019 showed large enhancing lesions in the periventricular white matter of both posterior frontal/parietal lobes and another small T2 hyperintense foci in the periventricular white matter.    MRI of the cervical spine 05/16/2019 shows foci adjacent to C1-C2 and adjacent to C2-C3 (enhancing) and C5.  REVIEW OF SYSTEMS: Constitutional: No fevers, chills, sweats, or change in appetite.  He has fatigue.  He has insomnia. Eyes: No visual changes, double vision, eye pain Ear, nose and throat: No hearing loss, ear pain, nasal congestion, sore throat Cardiovascular: No chest pain, palpitations Respiratory:  No shortness of  breath at rest or with exertion.   No wheezes GastrointestinaI: No nausea, vomiting, diarrhea, abdominal pain, fecal incontinence Genitourinary:  No dysuria, urinary retention or frequency.  No nocturia. Musculoskeletal: No neck pain.  He notes some back pain. Integumentary: No rash, pruritus, skin lesions Neurological: as above Psychiatric: No depression at this time.  No anxiety Endocrine: No palpitations, diaphoresis, change in appetite, change in weigh or increased thirst Hematologic/Lymphatic:  No anemia, purpura, petechiae. Allergic/Immunologic: No itchy/runny eyes, nasal congestion, recent allergic reactions, rashes  ALLERGIES: No Known  Allergies  HOME MEDICATIONS:  Current Outpatient Medications:    ibuprofen (ADVIL) 600 MG tablet, Take 600 mg by mouth every 6 (six) hours as needed for moderate pain., Disp: , Rfl:    naproxen (NAPROSYN) 500 MG tablet, Take 1 tablet (500 mg total) by mouth 2 (two) times daily with a meal., Disp: 30 tablet, Rfl: 0   natalizumab (TYSABRI) 300 MG/15ML injection, Inject 300 mg into the vein every 30 (thirty) days., Disp: , Rfl:    gabapentin (NEURONTIN) 400 MG capsule, Take 1 capsule (400 mg total) by mouth 2 (two) times daily., Disp: 90 capsule, Rfl: 11   pantoprazole (PROTONIX) 20 MG tablet, Take 1 tablet (20 mg total) by mouth daily., Disp: 30 tablet, Rfl: 0   Vitamin D, Ergocalciferol, (DRISDOL) 1.25 MG (50000 UNIT) CAPS capsule, Take 1 capsule (50,000 Units total) by mouth every 7 (seven) days., Disp: 13 capsule, Rfl: 1  PAST MEDICAL HISTORY: Past Medical History:  Diagnosis Date   MS (multiple sclerosis) (HCC)     PAST SURGICAL HISTORY: History reviewed. No pertinent surgical history.  FAMILY HISTORY: History reviewed. No pertinent family history.  SOCIAL HISTORY: Social History   Socioeconomic History   Marital status: Single    Spouse name: Not on file   Number of children: Not on file   Years of education: Not on file   Highest education level: Not on file  Occupational History   Not on file  Tobacco Use   Smoking status: Every Day    Types: Cigarettes   Smokeless tobacco: Never  Vaping Use   Vaping Use: Never used  Substance and Sexual Activity   Alcohol use: Yes   Drug use: Yes    Frequency: 5.0 times per week    Types: Marijuana   Sexual activity: Not on file  Other Topics Concern   Not on file  Social History Narrative   Not on file   Social Determinants of Health   Financial Resource Strain: Not on file  Food Insecurity: Not on file  Transportation Needs: Not on file  Physical Activity: Not on file  Stress: Not on file  Social Connections: Not on  file  Intimate Partner Violence: Not on file       PHYSICAL EXAM  Vitals:   10/17/22 1328  BP: (!) 97/53  Pulse: 82  Weight: 155 lb (70.3 kg)  Height: 5\' 9"  (1.753 m)    Body mass index is 22.89 kg/m.    General: The patient is well-developed and well-nourished and in no acute distress  HEENT:  Head is [place]/AT.  Sclera are anicteric.  Funduscopic exam shows normal optic discs and retinal vessels.  Neck: No carotid bruits are noted.  The neck is nontender.  Cardiovascular: The heart has a regular rate and rhythm with a normal S1 and S2. There were no murmurs, gallops or rubs.    Skin: Extremities are without rash or  edema.  Musculoskeletal:  Back is  nontender  Neurologic Exam  Mental status: The patient is alert and oriented x 3 at the time of the examination. The patient has apparent normal recent and remote memory, with an apparently normal attention span and concentration ability.   Speech is normal.  Cranial nerves: Extraocular movements are full. Pupils are equal, round, and reactive to light and accomodation.  Visual fields are full though notes acuity is slightly better OS.  Colors are symmetric.    Facial symmetry is present. There is good facial sensation to soft touch bilaterally.Facial strength is normal.  Trapezius and sternocleidomastoid strength is normal. No dysarthria is noted.  The tongue is midline, and the patient has symmetric elevation of the soft palate. No obvious hearing deficits are noted.  Motor:  Muscle bulk is normal.   Tone is normal. Strength is  5 / 5 in all 4 extremities.   Sensory: Sensory testing ishows reduced temperature sensation on the right and reduced vibration sensation in right leg.    Coordination: Cerebellar testing reveals good finger-nose-finger and slightly reduced heel-to-shin bilaterally.  Gait and station: Station is normal.   Gait is slightly wide.  Tandem gait is wide.  Romberg is negative.   Reflexes: Deep tendon  reflexes are symmetric and normal in the arms and increased at the knees with spread.  No ankle clonus..   Plantar responses are flexor.    DIAGNOSTIC DATA (LABS, IMAGING, TESTING) - I reviewed patient records, labs, notes, testing and imaging myself where available.  Lab Results  Component Value Date   WBC 6.7 02/15/2022   HGB 13.5 02/15/2022   HCT 40.9 02/15/2022   MCV 94.2 02/15/2022   PLT 206 02/15/2022      Component Value Date/Time   NA 136 02/15/2022 0946   K 4.2 02/15/2022 0946   CL 100 02/15/2022 0946   CO2 28 02/15/2022 0946   GLUCOSE 76 02/15/2022 0946   BUN 12 02/15/2022 0946   CREATININE 1.00 02/15/2022 0946   CALCIUM 9.7 02/15/2022 0946   GFRNONAA >60 02/15/2022 0946       ASSESSMENT AND PLAN  Multiple sclerosis ([place]) - Plan: Stratify JCV Antibody Test ([facility]), CBC with Differential/Platelet, QuantiFERON-TB Gold Plus, Comprehensive metabolic panel, Hepatitis B core antibody, total, Hepatitis B surface antibody,qualitative, Hep B Surface Antigen, IgG, IgA, IgM, Varicella zoster antibody, IgG, HIV Antibody (routine testing w rflx)  High risk medication use - Plan: Stratify JCV Antibody Test ([facility]), CBC with Differential/Platelet, QuantiFERON-TB Gold Plus, Comprehensive metabolic panel, Hepatitis B core antibody, total, Hepatitis B surface antibody,qualitative, Hep B Surface Antigen, IgG, IgA, IgM, Varicella zoster antibody, IgG, HIV Antibody (routine testing w rflx)  Gait disturbance  Vitamin D deficiency  Dysesthesia  Insomnia, unspecified type  Other fatigue   In summary, Mr. Loiseau is a 36 year old man with multiple sclerosis diagnosed in 2020.  He has been stable on Tysabri and I recommend that we continue that medication as long as he is either JCV negative or JCV low positive.  If he is JCV middle or high positive, then I would recommend that we switch to another highly effective disease modifying therapy such as Ocrevus or Briumvi.  We will check  lab work in preparation for either of these options.   He has dysesthesias and I will increase the gabapentin to 400 g p.o. 3 times daily.  We will renew the vitamin D.  He will return to see me in several months or sooner if there are new or worsening neurologic symptoms.  Nikolas Casher A. Epimenio Foot, MD, [facility] 10/17/2022, 2:27 PM Certified in Neurology, Clinical Neurophysiology, Sleep Medicine and Neuroimaging  [facility] [address] [place], [place] [address] [phone]

## 2022-10-17 ENCOUNTER — Ambulatory Visit (INDEPENDENT_AMBULATORY_CARE_PROVIDER_SITE_OTHER): Payer: 59 | Admitting: Neurology

## 2022-10-17 ENCOUNTER — Telehealth: Payer: Self-pay

## 2022-10-17 ENCOUNTER — Telehealth: Payer: Self-pay | Admitting: *Deleted

## 2022-10-17 ENCOUNTER — Encounter: Payer: Self-pay | Admitting: Neurology

## 2022-10-17 VITALS — BP 97/53 | HR 82 | Ht 69.0 in | Wt 155.0 lb

## 2022-10-17 DIAGNOSIS — Z79899 Other long term (current) drug therapy: Secondary | ICD-10-CM

## 2022-10-17 DIAGNOSIS — G4700 Insomnia, unspecified: Secondary | ICD-10-CM | POA: Diagnosis not present

## 2022-10-17 DIAGNOSIS — G35 Multiple sclerosis: Secondary | ICD-10-CM | POA: Diagnosis not present

## 2022-10-17 DIAGNOSIS — R208 Other disturbances of skin sensation: Secondary | ICD-10-CM | POA: Diagnosis not present

## 2022-10-17 DIAGNOSIS — R5383 Other fatigue: Secondary | ICD-10-CM

## 2022-10-17 DIAGNOSIS — R269 Unspecified abnormalities of gait and mobility: Secondary | ICD-10-CM | POA: Diagnosis not present

## 2022-10-17 DIAGNOSIS — E559 Vitamin D deficiency, unspecified: Secondary | ICD-10-CM

## 2022-10-17 MED ORDER — VITAMIN D (ERGOCALCIFEROL) 1.25 MG (50000 UNIT) PO CAPS: 50000.0000 [IU] | ORAL_CAPSULE | ORAL | 1 refills | Status: DC

## 2022-10-17 MED ORDER — GABAPENTIN 400 MG PO CAPS: 400.0000 mg | ORAL_CAPSULE | Freq: Two times a day (BID) | ORAL | 11 refills | Status: DC

## 2022-10-17 NOTE — Telephone Encounter (Signed)
Received Tysabri start form from Dr. Felecia Shelling.  He asked me to go ahead and start processing his form while JCV is pending.  He would like to keep the patient on Tysabri every 6 weeks.  I have given this order and the start form to the infusion suite for processing.  I have faxed the start form to [place]. Received a receipt of confirmation.

## 2022-10-17 NOTE — Telephone Encounter (Signed)
Placed JCV lab in [facility] lock box for routine lab pick up. Results pending. 

## 2022-10-18 ENCOUNTER — Telehealth: Payer: Self-pay | Admitting: Pharmacy Technician

## 2022-10-18 NOTE — Telephone Encounter (Signed)
Tysabri f/u:  Upcoming tysabri appt has been cancelled due to patient has transferred sites.

## 2022-10-20 LAB — COMPREHENSIVE METABOLIC PANEL
ALT: 9 IU/L (ref 0–44)
AST: 16 IU/L (ref 0–40)
Albumin/Globulin Ratio: 2.1 (ref 1.2–2.2)
Albumin: 5 g/dL (ref 4.1–5.1)
Alkaline Phosphatase: 62 IU/L (ref 44–121)
BUN/Creatinine Ratio: 11 (ref 9–20)
BUN: 11 mg/dL (ref 6–20)
Bilirubin Total: 0.9 mg/dL (ref 0.0–1.2)
CO2: 28 mmol/L (ref 20–29)
Calcium: 10.4 mg/dL — ABNORMAL HIGH (ref 8.7–10.2)
Chloride: 99 mmol/L (ref 96–106)
Creatinine, Ser: 1.02 mg/dL (ref 0.76–1.27)
Globulin, Total: 2.4 g/dL (ref 1.5–4.5)
Glucose: 83 mg/dL (ref 70–99)
Potassium: 4.3 mmol/L (ref 3.5–5.2)
Sodium: 139 mmol/L (ref 134–144)
Total Protein: 7.4 g/dL (ref 6.0–8.5)
eGFR: 99 mL/min/{1.73_m2} (ref >59)

## 2022-10-20 LAB — IGG, IGA, IGM
IgA/Immunoglobulin A, Serum: 166 mg/dL (ref 90–386)
IgG (Immunoglobin G), Serum: 887 mg/dL (ref 603–1613)
IgM (Immunoglobulin M), Srm: 66 mg/dL (ref 20–172)

## 2022-10-20 LAB — CBC WITH DIFFERENTIAL/PLATELET
Basophils Absolute: 0 10*3/uL (ref 0.0–0.2)
Basos: 0 %
EOS (ABSOLUTE): 0.2 10*3/uL (ref 0.0–0.4)
Eos: 3 %
Hematocrit: 39.9 % (ref 37.5–51.0)
Hemoglobin: 13.6 g/dL (ref 13.0–17.7)
Immature Grans (Abs): 0 10*3/uL (ref 0.0–0.1)
Immature Granulocytes: 0 %
Lymphocytes Absolute: 2.6 10*3/uL (ref 0.7–3.1)
Lymphs: 40 %
MCH: 31.5 pg (ref 26.6–33.0)
MCHC: 34.1 g/dL (ref 31.5–35.7)
MCV: 92 fL (ref 79–97)
Monocytes Absolute: 0.5 10*3/uL (ref 0.1–0.9)
Monocytes: 7 %
Neutrophils Absolute: 3.3 10*3/uL (ref 1.4–7.0)
Neutrophils: 50 %
Platelets: 215 10*3/uL (ref 150–450)
RBC: 4.32 x10E6/uL (ref 4.14–5.80)
RDW: 13.2 % (ref 11.6–15.4)
WBC: 6.6 10*3/uL (ref 3.4–10.8)

## 2022-10-20 LAB — QUANTIFERON-TB GOLD PLUS
QuantiFERON Mitogen Value: >10 IU/mL
QuantiFERON Nil Value: 0 IU/mL
QuantiFERON TB1 Ag Value: 0 IU/mL
QuantiFERON TB2 Ag Value: 0 IU/mL
QuantiFERON-TB Gold Plus: NEGATIVE

## 2022-10-20 LAB — HEPATITIS B SURFACE ANTIBODY,QUALITATIVE: Hep B Surface Ab, Qual: REACTIVE

## 2022-10-20 LAB — VARICELLA ZOSTER ANTIBODY, IGG: Varicella zoster IgG: 2130 index (ref >165)

## 2022-10-20 LAB — HEPATITIS B SURFACE ANTIGEN: Hepatitis B Surface Ag: NEGATIVE

## 2022-10-20 LAB — HEPATITIS B CORE ANTIBODY, TOTAL: Hep B Core Total Ab: NEGATIVE

## 2022-10-20 LAB — HIV ANTIBODY (ROUTINE TESTING W REFLEX): HIV Screen 4th Generation wRfx: NONREACTIVE

## 2022-10-23 NOTE — Telephone Encounter (Signed)
Confirmed with [facility] that they have everything that need for patient for his tysabri infusions at [facility]: "No, we do not need anything else at this point. His reauth is valid through 02/01/23, so on our end he is good to infuse."

## 2022-10-24 ENCOUNTER — Telehealth: Payer: Self-pay | Admitting: Neurology

## 2022-10-24 NOTE — Telephone Encounter (Signed)
Pt is calling. Stated his hip and legs are hurting him to the point he had to leave work early. Pt is requesting an appointment to see Dr. Felecia Shelling.

## 2022-10-25 ENCOUNTER — Other Ambulatory Visit: Payer: Self-pay | Admitting: Pharmacy Technician

## 2022-10-25 MED ORDER — GABAPENTIN 400 MG PO CAPS: 400.0000 mg | ORAL_CAPSULE | Freq: Three times a day (TID) | ORAL | 11 refills | Status: DC

## 2022-10-25 MED ORDER — METHYLPREDNISOLONE 4 MG PO TBPK: ORAL_TABLET | ORAL | 0 refills | Status: DC

## 2022-10-25 NOTE — Telephone Encounter (Signed)
Called pt back. Advised I spoke with Dr. Epimenio Foot who recommends medrol dos pak. [facility] He will also pick up gabapentin 400mg  po TID and start this increased dose. He will call back if this is ineffective.

## 2022-10-25 NOTE — Telephone Encounter (Signed)
Called pt back. He is having severe hip/leg pain. This has been ongoing. Has worsening balance, not able to walk d/t pain. He walked out of new job yesterday and they decided to let him go because of this.  He has not started gabapentin 400mg  po TID as instructed at visit with Dr. 10/17/22. States he went to pick up new rx that day but pharmacy gave him 300mg . Aware I will send in new rx advising dose increased and he should pick up this higher dose.  In the meantime, I will speak with Dr. 10/19/22 to see if he has any other recommendations.

## 2022-10-27 ENCOUNTER — Ambulatory Visit: Payer: 59

## 2022-10-30 NOTE — Telephone Encounter (Signed)
JCV ab drawn 10/17/22 positive, index: 0.88.

## 2022-11-01 ENCOUNTER — Telehealth: Payer: 59 | Admitting: Urgent Care

## 2022-11-01 ENCOUNTER — Telehealth: Payer: Self-pay | Admitting: Neurology

## 2022-11-01 MED ORDER — ETODOLAC 400 MG PO TABS: 400.0000 mg | ORAL_TABLET | Freq: Two times a day (BID) | ORAL | 5 refills | Status: DC

## 2022-11-01 NOTE — Addendum Note (Signed)
Addended by: Arther Abbott on: 11/01/2022 01:08 PM   Modules accepted: Orders

## 2022-11-01 NOTE — Patient Instructions (Signed)
The patient no-showed for appointment despite this provider sending direct link, reaching out via phone with no response and waiting for at least 10 minutes from appointment time for patient to join. They will be marked as a NS for this appointment/time.   Nature Vogelsang L Khloei Spiker, PA    

## 2022-11-01 NOTE — Telephone Encounter (Signed)
Steroid sent in on 10/25/22. I know you would not want to do another one so close. Is there an alternate option for the continued pain?

## 2022-11-01 NOTE — Telephone Encounter (Signed)
Pt is calling. Stated medication methylPREDNISolone (MEDROL DOSEPAK) 4 MG TBPK tablet helped his leg and wanted to know if Dr. Epimenio Foot will write him another prescription because he is still having some pain.

## 2022-11-01 NOTE — Telephone Encounter (Signed)
Called and spoke with pt. Relayed Dr. Bonnita Hollow message. He no longer takes naproxen, I d/c'd off med list. He is agreeable to try etodolac 400mg  po BID. [facility] He verbalized understanding.

## 2022-11-01 NOTE — Progress Notes (Signed)
The patient no-showed for appointment despite this provider sending direct link, reaching out via phone with no response and waiting for at least 10 minutes from appointment time for patient to join. They will be marked as a NS for this appointment/time.   Takuma Cifelli L Hugo Lybrand, PA    

## 2022-11-22 ENCOUNTER — Encounter: Payer: Self-pay | Admitting: Neurology

## 2022-11-22 DIAGNOSIS — G35 Multiple sclerosis: Secondary | ICD-10-CM | POA: Diagnosis not present

## 2022-11-28 ENCOUNTER — Encounter: Payer: Self-pay | Admitting: *Deleted

## 2022-11-28 ENCOUNTER — Telehealth: Payer: Self-pay | Admitting: Neurology

## 2022-11-28 NOTE — Telephone Encounter (Signed)
Patient had his first Tysabri infusion on 11/22/22.

## 2022-11-28 NOTE — Telephone Encounter (Signed)
Per Dr Epimenio Foot, ok to write patient out of work starting 11/29/22 and for him to return 12/04/22. Letter released to pt mychart. I tried calling pt, mailbox full. Sent SMS notification.   If pt calls, please relay above message.

## 2022-11-28 NOTE — Telephone Encounter (Signed)
Printed letter. MD signed and I placed up front for pt pick up.

## 2022-11-28 NOTE — Telephone Encounter (Signed)
Pt called back. I read him message from nurse Kara Mead. He is asking if a copy of the letter be put up front because he can't get into his mychart.

## 2022-11-28 NOTE — Telephone Encounter (Signed)
Called pt to get further info. Having worsening leg pain in thigh area on left side. Started yesterday. Has not done anything outside normal activity. No new meds added recently. No illness/infection currently. Still taking etodolac 400mg  but only taking once daily, not twice daily as written. He didn't see he could take it twice daily. I instructed him to increase to twice daily to see if this helps with pain.  He wants to be written out of work the rest of this week, to return 12/04/22. Aware I will have to have MD review and will call back about this request.

## 2022-11-28 NOTE — Telephone Encounter (Signed)
Pt is calling. Stated he needs a work note to be out for two or three days. Pt said his leg is hurting bad. Pt is requesting a call-back from nurse.

## 2022-12-13 ENCOUNTER — Telehealth: Payer: Self-pay | Admitting: Neurology

## 2022-12-13 ENCOUNTER — Encounter: Payer: Self-pay | Admitting: Neurology

## 2022-12-13 NOTE — Telephone Encounter (Signed)
Pt would like a call back to discuss JCV results and next steps.

## 2022-12-13 NOTE — Telephone Encounter (Signed)
Pt is asking for another call back from Estelle, RN to discuss reason for her initial call.

## 2022-12-13 NOTE — Telephone Encounter (Signed)
Called pt back to further discuss. Explained what JCV ab was and he will continue on Tysabri q 6wk d.t result. Reminded him of appt 03/28/23 and aware we will recheck JCV then.

## 2022-12-13 NOTE — Telephone Encounter (Signed)
LVM returning pt call. Looks like JCV ab drawn 10/17/22 positive, index: 0.88. Dr. Epimenio Foot had him start on Tysabri and gets q 6wk. Had first infusion 11/22/22.

## 2023-01-08 DIAGNOSIS — G35 Multiple sclerosis: Secondary | ICD-10-CM | POA: Diagnosis not present

## 2023-01-09 ENCOUNTER — Telehealth: Payer: Self-pay | Admitting: *Deleted

## 2023-01-09 NOTE — Telephone Encounter (Signed)
Gave completed/signed form to pt today. He stopped by office requesting this be filled out this am.

## 2023-01-29 ENCOUNTER — Telehealth: Payer: Self-pay | Admitting: Neurology

## 2023-01-29 NOTE — Telephone Encounter (Signed)
Pt is calling requesting a refill on etodolac (LODINE) 400 MG tablet  and gabapentin (NEURONTIN) 400 MG capsule. [facility]  [id number] [place] [place]

## 2023-01-29 NOTE — Telephone Encounter (Signed)
Please also send mychart. Looks like he is active on this.

## 2023-01-29 NOTE — Telephone Encounter (Signed)
Called pt several times and am not able to reach or LVM due to VM being full.

## 2023-01-29 NOTE — Telephone Encounter (Signed)
Please call pt back. There should be refills for both [facility] and [facility] for [facility] He needs to contact the pharmacy.  Gabapentin sent in 10/25/22 #90 and 11 refills (1 yr supply) and etodolac 11/01/22 #60 and 5 refills (6 month supply)

## 2023-01-30 ENCOUNTER — Telehealth: Payer: Self-pay | Admitting: Neurology

## 2023-02-01 NOTE — Telephone Encounter (Signed)
Error

## 2023-02-05 ENCOUNTER — Telehealth: Payer: Self-pay | Admitting: Neurology

## 2023-02-05 NOTE — Telephone Encounter (Signed)
Spoke with Dr. Felecia Shelling, he approved filling out FL2 form for pt. In process of completing.

## 2023-02-05 NOTE — Telephone Encounter (Signed)
Pt is calling stating he is applying for medicaid and needs a FL2 filled out by Dr. Felecia Shelling.

## 2023-02-06 NOTE — Telephone Encounter (Signed)
Form pending MD signature

## 2023-02-07 NOTE — Telephone Encounter (Signed)
Gave completed/signed form back to medical records to process for pt. 

## 2023-03-28 ENCOUNTER — Encounter: Payer: Self-pay | Admitting: Neurology

## 2023-03-28 ENCOUNTER — Telehealth: Payer: Self-pay | Admitting: *Deleted

## 2023-03-28 ENCOUNTER — Ambulatory Visit (INDEPENDENT_AMBULATORY_CARE_PROVIDER_SITE_OTHER): Payer: Medicaid Other | Admitting: Neurology

## 2023-03-28 VITALS — BP 95/44 | HR 64 | Ht 69.5 in | Wt 180.8 lb

## 2023-03-28 DIAGNOSIS — G4700 Insomnia, unspecified: Secondary | ICD-10-CM

## 2023-03-28 DIAGNOSIS — R208 Other disturbances of skin sensation: Secondary | ICD-10-CM

## 2023-03-28 DIAGNOSIS — G35 Multiple sclerosis: Secondary | ICD-10-CM

## 2023-03-28 DIAGNOSIS — R269 Unspecified abnormalities of gait and mobility: Secondary | ICD-10-CM

## 2023-03-28 DIAGNOSIS — Z79899 Other long term (current) drug therapy: Secondary | ICD-10-CM

## 2023-03-28 MED ORDER — BACLOFEN 10 MG PO TABS
10.0000 mg | ORAL_TABLET | Freq: Three times a day (TID) | ORAL | 5 refills | Status: DC
Start: 2023-03-28 — End: 2023-06-18

## 2023-03-28 NOTE — Telephone Encounter (Signed)
Placed JCV lab in [facility] lock box for routine lab pick up. Results pending. 

## 2023-03-28 NOTE — Progress Notes (Signed)
[facility]  PATIENT: Alvin Williams DOB: Feb 18, 1986  REFERRING DOCTOR OR PCP: Beryle Beams MD SOURCE: Patient, notes from Dr. Gerilyn Pilgrim, laboratory reports, MRI reports, MRI images personally reviewed.  _________________________________   HISTORICAL  CHIEF COMPLAINT:  Chief Complaint  Patient presents with   Follow-up    Pt in room 11. Here for MS follow up. Pt states legs feel heavy always, balance is off.    HISTORY OF PRESENT ILLNESS:  Alvin Williams is a 37 y.o. man who was diagnosed with MS in 2020.    Update 03/28/2023: He is on Tysabri with a low positive titer (0.88 on 12/13/2022).  He takes Tysabri every 6 weeks.   He is getting the infusions in this office.      He tolerates Tysabri well but feels more fatigued the last 2 weeks of each cycle.       Currently, he notes his gait is slightly off balanced.   He uses the bannister on stairs.  He as pain in both legs., left > right.  He notes some leg numbness.   He notes spasticity in leg muscles. Sometimes uncomfortable,   He also notes milder arm pain and numbness   He will sometimes drop items.     Bladder function is fine.    He denies any issues with his vision currently.but had nystagmus at the time of diagnosis.    He notes reduced memory and focus.  He has some depression and anxiety.   He sleeps only 5 hours most night due to repeated awakenings.     He notes fatigue.  He is otherwise in good health.   He is not able to work out as much as he used to.     MS HISTORY In 2019 he had an episode of numbness and pain in the legs x 2 weeks.  One month later, he had symptoms in the left hand x 1 month.   A couple months later, while at work he noted vision was blurry when he would turn his head for a few seconds at a time and his vision was jerking.     He was referred to Dr, Gerilyn Pilgrim and then had MRI's performed that were consistent with MS.  Marland Kitchen   He was placed on Tysabri and continues on it.    He has  done well with no exacerbation.    He has been doing infusions at [facility].    Last infusion was 2 weeks ago.   By report, he is JCV positive and the Tysabri was stretched to every 6 weeks.  I do not have the titer.    Imaging:  MRI of the brain 10/20/2021 showed multiple T2/FLAIR hyperintense foci in the cerebral hemispheres, predominantly in the periventricular white matter radially oriented to the ventricles.  Foci also noted in the right cerebellar hemisphere, and right posterior pons.    MRI of the cervical spine 10/20/2021 showed T2 hyperintense foci within the spinal cord adjacent to C1-C2, adjacent to C3 centrally and towards the left, adjacent to C3 posterolaterally to the right (small), adjacent to C4-C5 posterolaterally to the right  MRI of the brain 05/16/2019 showed large enhancing lesions in the periventricular white matter of both posterior frontal/parietal lobes and another small T2 hyperintense foci in the periventricular white matter.    MRI of the cervical spine 05/16/2019 shows foci adjacent to C1-C2 and adjacent to C2-C3 (enhancing) and C5.  REVIEW OF SYSTEMS: Constitutional: No fevers, chills, sweats,  or change in appetite.  He has fatigue.  He has insomnia. Eyes: No visual changes, double vision, eye pain Ear, nose and throat: No hearing loss, ear pain, nasal congestion, sore throat Cardiovascular: No chest pain, palpitations Respiratory:  No shortness of breath at rest or with exertion.   No wheezes GastrointestinaI: No nausea, vomiting, diarrhea, abdominal pain, fecal incontinence Genitourinary:  No dysuria, urinary retention or frequency.  No nocturia. Musculoskeletal: No neck pain.  He notes some back pain. Integumentary: No rash, pruritus, skin lesions Neurological: as above Psychiatric: No depression at this time.  No anxiety Endocrine: No palpitations, diaphoresis, change in appetite, change in weigh or increased thirst Hematologic/Lymphatic:  No anemia, purpura,  petechiae. Allergic/Immunologic: No itchy/runny eyes, nasal congestion, recent allergic reactions, rashes  ALLERGIES: No Known Allergies  HOME MEDICATIONS:  Current Outpatient Medications:    baclofen (LIORESAL) 10 MG tablet, Take 1 tablet (10 mg total) by mouth 3 (three) times daily., Disp: 90 each, Rfl: 5   gabapentin (NEURONTIN) 400 MG capsule, Take 1 capsule (400 mg total) by mouth 3 (three) times daily., Disp: 90 capsule, Rfl: 11   natalizumab (TYSABRI) 300 MG/15ML injection, Inject 300 mg into the vein every 30 (thirty) days., Disp: , Rfl:    Vitamin D, Ergocalciferol, (DRISDOL) 1.25 MG (50000 UNIT) CAPS capsule, Take 1 capsule (50,000 Units total) by mouth every 7 (seven) days., Disp: 13 capsule, Rfl: 1   etodolac (LODINE) 400 MG tablet, Take 1 tablet (400 mg total) by mouth 2 (two) times daily. (Patient not taking: Reported on 03/28/2023), Disp: 60 tablet, Rfl: 5   ibuprofen (ADVIL) 600 MG tablet, Take 600 mg by mouth every 6 (six) hours as needed for moderate pain. (Patient not taking: Reported on 03/28/2023), Disp: , Rfl:    methylPREDNISolone (MEDROL DOSEPAK) 4 MG TBPK tablet, Take 6 tablets on day 1 and decrease by 1 tablet each day until finished (Patient not taking: Reported on 03/28/2023), Disp: 21 tablet, Rfl: 0   pantoprazole (PROTONIX) 20 MG tablet, Take 1 tablet (20 mg total) by mouth daily., Disp: 30 tablet, Rfl: 0  PAST MEDICAL HISTORY: Past Medical History:  Diagnosis Date   MS (multiple sclerosis)     PAST SURGICAL HISTORY: No past surgical history on file.  FAMILY HISTORY: No family history on file.  SOCIAL HISTORY: Social History   Socioeconomic History   Marital status: Single    Spouse name: Not on file   Number of children: Not on file   Years of education: Not on file   Highest education level: Not on file  Occupational History   Not on file  Tobacco Use   Smoking status: Every Day    Types: Cigarettes   Smokeless tobacco: Never  Vaping Use    Vaping Use: Never used  Substance and Sexual Activity   Alcohol use: Yes   Drug use: Yes    Frequency: 5.0 times per week    Types: Marijuana   Sexual activity: Not on file  Other Topics Concern   Not on file  Social History Narrative   Not on file   Social Determinants of Health   Financial Resource Strain: Not on file  Food Insecurity: Not on file  Transportation Needs: Not on file  Physical Activity: Not on file  Stress: Not on file  Social Connections: Not on file  Intimate Partner Violence: Not on file       PHYSICAL EXAM  Vitals:   03/28/23 1506  BP: (!) 95/44  Pulse: 64  Weight: 180 lb 12.8 oz (82 kg)  Height: 5' 9.5" (1.765 m)    Body mass index is 26.32 kg/m.    General: The patient is well-developed and well-nourished and in no acute distress  HEENT:  Head is [place]/AT.  Sclera are anicteric.   Skin: Extremities are without rash or  edema.  Musculoskeletal:  Back is nontender  Neurologic Exam  Mental status: The patient is alert and oriented x 3 at the time of the examination. The patient has apparent normal recent and remote memory, with an apparently normal attention span and concentration ability.   Speech is normal.  Cranial nerves: Extraocular movements are full. Pupils are equal, round, and reactive to light and accomodation.  Visual fields are full though notes acuity is slightly better OS.  Colors are symmetric.    Facial symmetry is present. There is good facial sensation to soft touch bilaterally.Facial strength is normal.  Trapezius and sternocleidomastoid strength is normal. No dysarthria is noted.  No obvious hearing deficits are noted.  Motor:  Muscle bulk is normal.   Tone is normal. Strength is  5 / 5 in all 4 extremities.   Sensory: Sensory testing ishows reduced temperature sensation on the right and reduced vibration sensation in right leg.    Coordination: Cerebellar testing reveals good finger-nose-finger and slightly reduced  heel-to-shin bilaterally.  Gait and station: Station is normal.   Gait is slightly wide.  Tandem gait is wide.  Romberg is negative.   Reflexes: Deep tendon reflexes are symmetric and normal in the arms and increased at the knees with spread.  No ankle clonus.     DIAGNOSTIC DATA (LABS, IMAGING, TESTING) - I reviewed patient records, labs, notes, testing and imaging myself where available.  Lab Results  Component Value Date   WBC 6.6 10/17/2022   HGB 13.6 10/17/2022   HCT 39.9 10/17/2022   MCV 92 10/17/2022   PLT 215 10/17/2022      Component Value Date/Time   NA 139 10/17/2022 1417   K 4.3 10/17/2022 1417   CL 99 10/17/2022 1417   CO2 28 10/17/2022 1417   GLUCOSE 83 10/17/2022 1417   GLUCOSE 76 02/15/2022 0946   BUN 11 10/17/2022 1417   CREATININE 1.02 10/17/2022 1417   CALCIUM 10.4 (H) 10/17/2022 1417   PROT 7.4 10/17/2022 1417   ALBUMIN 5.0 10/17/2022 1417   AST 16 10/17/2022 1417   ALT 9 10/17/2022 1417   ALKPHOS 62 10/17/2022 1417   BILITOT 0.9 10/17/2022 1417   GFRNONAA >60 02/15/2022 0946       ASSESSMENT AND PLAN  Multiple sclerosis - Plan: Stratify JCV Antibody Test ([facility]), CBC with Differential/Platelet  High risk medication use - Plan: Stratify JCV Antibody Test ([facility]), CBC with Differential/Platelet  Gait disturbance  Dysesthesia  Insomnia, unspecified type  Continue Tysabri   and check labs.  consider [name] if JCV AB > 0.9 Baclofen 10 tid for spasticity  continue gabapentin Rtc 6 months or sooner if new or worsening issues  Klani Caridi A. Epimenio Foot, MD, [facility] 03/28/2023, 4:09 PM Certified in Neurology, Clinical Neurophysiology, Sleep Medicine and Neuroimaging  [facility] [address], [address] [place], [place] [address] [phone]

## 2023-03-29 LAB — CBC WITH DIFFERENTIAL/PLATELET
Basophils Absolute: 0 10*3/uL (ref 0.0–0.2)
Basos: 0 %
EOS (ABSOLUTE): 0.2 10*3/uL (ref 0.0–0.4)
Eos: 4 %
Hematocrit: 42.3 % (ref 37.5–51.0)
Hemoglobin: 14.1 g/dL (ref 13.0–17.7)
Immature Grans (Abs): 0 10*3/uL (ref 0.0–0.1)
Immature Granulocytes: 0 %
Lymphocytes Absolute: 2.2 10*3/uL (ref 0.7–3.1)
Lymphs: 48 %
MCH: 30.3 pg (ref 26.6–33.0)
MCHC: 33.3 g/dL (ref 31.5–35.7)
MCV: 91 fL (ref 79–97)
Monocytes Absolute: 0.5 10*3/uL (ref 0.1–0.9)
Monocytes: 10 %
Neutrophils Absolute: 1.8 10*3/uL (ref 1.4–7.0)
Neutrophils: 38 %
Platelets: 258 10*3/uL (ref 150–450)
RBC: 4.66 x10E6/uL (ref 4.14–5.80)
RDW: 12.4 % (ref 11.6–15.4)
WBC: 4.7 10*3/uL (ref 3.4–10.8)

## 2023-04-02 NOTE — Telephone Encounter (Signed)
JCV ab drawn on 03/28/23 positive, index: 0.98. Gave to MD for review.

## 2023-04-04 ENCOUNTER — Telehealth: Payer: Self-pay | Admitting: *Deleted

## 2023-04-04 MED ORDER — PREDNISONE 50 MG PO TABS
600.0000 mg | ORAL_TABLET | Freq: Every day | ORAL | 0 refills | Status: AC
Start: 2023-04-04 — End: 2023-04-07

## 2023-04-04 NOTE — Addendum Note (Signed)
Addended by: Arther Abbott on: 04/04/2023 01:36 PM   Modules accepted: Orders

## 2023-04-04 NOTE — Telephone Encounter (Signed)
Faxed completed/signed [name] start form to [name] at [phone]. Received fax confirmation. 

## 2023-04-04 NOTE — Addendum Note (Signed)
Addended by: Arther Abbott on: 04/04/2023 11:31 AM   Modules accepted: Orders

## 2023-04-04 NOTE — Telephone Encounter (Signed)
Called girlfriend back. Relayed Dr. Bonnita Hollow recommendation. Agreeable to plan. E-scribed rx prednisone to [id number] - [place], [place] - [address].   She will have him f/u with [facility] if wrist pain does not improve/worsens.

## 2023-04-04 NOTE — Telephone Encounter (Addendum)
Called and LVM for pt at [phone] to call office. Per Dr. Epimenio Foot, since JCV elevated further, he wants to have him discontinue Tysabri and start on [name] (pt signed start form when he was here that we will send in).   Called [phone]. Spoke w/ girlfriend Baron Sane [place]) on [place]. Relayed above message. She verbalized understanding.   She wanted to report that he fell in bathroom two days ago. He closed eyes and fell onto left side, hurt left wrist. Very swollen but does not feel its broken. He is unable to work out right now d/t pain/swelling. He typically does not lose balance when he closes eyes per girlfriend. She feels he may be having MS flare. He is losing weight and in more pain. Recommended he continue on baclofen and gabapentin. She wants to know what Dr. Epimenio Foot recommends in the meantime to help w/ sx until he can get switched over to [facility]. Aware I will send to MD for review.

## 2023-04-23 NOTE — Telephone Encounter (Signed)
I called patient.  I spoke with he and his girlfriend, Baron Sane, per DPR.  They report that they were unsure of what [name] was.  They thought it was an infusion.  They did not realize that patient will be stopping Tysabri.  I explained that [name] is a self injectable medication.  It is once monthly.  They reported that they did speak to a pharmacist and they were sending a video on how to do the injection.  However, they would like their first [name] injection to be done in the office.  I advised him that I would check with the clinic nurses to see if this was possible.  They also had questions about what JCV antibodies are and why he cannot be on Tysabri, which I explained to them.  They had no further questions at this time.

## 2023-04-24 NOTE — Telephone Encounter (Signed)
Phone room: did you try both these numbers: [phone] and [phone]?

## 2023-04-24 NOTE — Telephone Encounter (Signed)
Phone room: please call back and let them know that once he receives [name], they can call our office to schedule nurse appt to do first injection in the office.

## 2023-04-24 NOTE — Telephone Encounter (Signed)
Noted  

## 2023-05-02 NOTE — Telephone Encounter (Signed)
Called pt again at [phone]. LVM for pt.   Called [phone]. Spoke w/ girlfriend Baron Sane [place]) on [place] . Scheduled first injection training for 05/07/23 at 4pm.

## 2023-05-02 NOTE — Telephone Encounter (Signed)
Pt was told to call when he received the [name]  in the mail.  Pt has it and is asking to be called to go about scheduling.

## 2023-05-02 NOTE — Telephone Encounter (Addendum)
Verified with intrafusion that last Tysabri was 01/08/23. He d/c'd this therapy and will be starting on [name].  Per Dr. Epimenio Foot, ok to bring pt in to do [name] injection training for first injection as soon as possible.  Called pt back and LVM for him to call office. Wanted to verify first that he has no active infection prior to scheduling for him to come in for training.  Phone room: If he calls please ask above question. If he says no, can offer today at 4pm or tomorrow before 11:30am.

## 2023-05-07 ENCOUNTER — Ambulatory Visit (INDEPENDENT_AMBULATORY_CARE_PROVIDER_SITE_OTHER): Payer: Medicaid Other | Admitting: *Deleted

## 2023-05-07 ENCOUNTER — Ambulatory Visit: Payer: Medicaid Other | Admitting: *Deleted

## 2023-05-07 ENCOUNTER — Encounter: Payer: Self-pay | Admitting: Neurology

## 2023-05-07 DIAGNOSIS — G35 Multiple sclerosis: Secondary | ICD-10-CM | POA: Diagnosis not present

## 2023-05-07 NOTE — Progress Notes (Signed)
Pt here with girlfriend, [name]. They arrived with just training pen, not [name] medication.   Girlfriend called [name] to get update. Rep states PA not needed as of April 2024. Specialty pharmacy: [facility]. She transferred them to [facility] to schedule delivery of medication. Advised them to schedule delivery of starter dose.  Provided Quick tips for use patient packet that included scheduling of starter/maintenance, injection site options and other facts on [name].   Went over injection site options with pt/girlfriend and used training pen to show them both how to complete injection.   [facility] set up delivery for start pack  05/10/23. Girlfriend will do first injection. Scheduled appt on nurse schedule for 2nd injection per pt request 05/17/23 at 4pm. Wanted this to make sure he was completing correctly and tolerating well.

## 2023-05-11 ENCOUNTER — Telehealth: Payer: Self-pay | Admitting: Neurology

## 2023-05-11 ENCOUNTER — Ambulatory Visit: Payer: Medicaid Other | Admitting: Neurology

## 2023-05-11 ENCOUNTER — Encounter: Payer: Self-pay | Admitting: Neurology

## 2023-05-11 VITALS — BP 140/75 | HR 76 | Ht 69.5 in | Wt 166.0 lb

## 2023-05-11 DIAGNOSIS — R269 Unspecified abnormalities of gait and mobility: Secondary | ICD-10-CM

## 2023-05-11 DIAGNOSIS — G35 Multiple sclerosis: Secondary | ICD-10-CM | POA: Diagnosis not present

## 2023-05-11 DIAGNOSIS — R208 Other disturbances of skin sensation: Secondary | ICD-10-CM

## 2023-05-11 DIAGNOSIS — Z79899 Other long term (current) drug therapy: Secondary | ICD-10-CM | POA: Diagnosis not present

## 2023-05-11 DIAGNOSIS — R5383 Other fatigue: Secondary | ICD-10-CM

## 2023-05-11 MED ORDER — GABAPENTIN 600 MG PO TABS: 600.0000 mg | ORAL_TABLET | Freq: Three times a day (TID) | ORAL | 5 refills | Status: DC

## 2023-05-11 MED ORDER — PREDNISONE 50 MG PO TABS: ORAL_TABLET | ORAL | 0 refills | Status: DC

## 2023-05-11 NOTE — Progress Notes (Signed)
[facility]  PATIENT: Alvin Williams DOB: 04/21/1986  REFERRING DOCTOR OR PCP: Beryle Beams MD SOURCE: Patient, notes from Dr. Gerilyn Pilgrim, laboratory reports, MRI reports, MRI images personally reviewed.  _________________________________   HISTORICAL  CHIEF COMPLAINT:  Chief Complaint  Patient presents with   Follow-up    RM11, ALONE [id number] treatments for the past 2-3 months, left leg pain for 1 week, tingly arms and hands/numbness    HISTORY OF PRESENT ILLNESS:  Alvin Williams is a 37 y.o. man who was diagnosed with MS in 2020.    Update 05/11/2023: I spoke to him by phone earlier this morning.  Because I am concerned that he could be having an exacerbation we scheduled this urgent visit for further evaluation.  For a week he has had much more pain in the left > right thighs.   He also has had more tingling in his arms and hands.  H notes his gait is worse.    He is not sure if gabapentin 400 mg po tid has helped.  He tolerates it well.       He was on Tysabri with a low positive titer (0.88 on 12/13/2022).  He takes Tysabri every 6 weeks.   He has been feeling much worse since changing from 4 weeks to 6 weeks.    He wished to change medications at the last visit.  Last infusion was late January 2024.  We are trying to get him [name]  Currently, he notes his gait is off balanced.  He had 1 fall.   He uses the bannister on stairs.  He has pain in both legs., left > right.  He notes some leg numbness.   He notes spasticity in leg muscles. Sometimes uncomfortable,   He also notes milder arm pain and numbness   He will sometimes drop items.     Bladder function is fine.    He denies any issues with his vision currently.but had nystagmus at the time of diagnosis.    He notes reduced memory and focus.  He has some depression and anxiety.   He feels he sleeps well most nights.  No snoring.   He notes fatigue.  He is otherwise in good health.   He is not able to  work out as much as he used to.     MS HISTORY In 2019 he had an episode of numbness and pain in the legs x 2 weeks.  One month later, he had symptoms in the left hand x 1 month.   A couple months later, while at work he noted vision was blurry when he would turn his head for a few seconds at a time and his vision was jerking.     He was referred to Dr, Gerilyn Pilgrim and then had MRI's performed that were consistent with MS.  Marland Kitchen   He was placed on Tysabri and continues on it.    He has done well with no exacerbation.    He has been doing infusions at [facility].    Last infusion was 2 weeks ago.   By report, he is JCV positive and the Tysabri was stretched to every 6 weeks.  I do not have the titer.    Imaging:  MRI of the brain 10/20/2021 showed multiple T2/FLAIR hyperintense foci in the cerebral hemispheres, predominantly in the periventricular white matter radially oriented to the ventricles.  Foci also noted in the right cerebellar hemisphere, and right posterior pons.  MRI of the cervical spine 10/20/2021 showed T2 hyperintense foci within the spinal cord adjacent to C1-C2, adjacent to C3 centrally and towards the left, adjacent to C3 posterolaterally to the right (small), adjacent to C4-C5 posterolaterally to the right  MRI of the brain 05/16/2019 showed large enhancing lesions in the periventricular white matter of both posterior frontal/parietal lobes and another small T2 hyperintense foci in the periventricular white matter.    MRI of the cervical spine 05/16/2019 shows foci adjacent to C1-C2 and adjacent to C2-C3 (enhancing) and C5.  REVIEW OF SYSTEMS: Constitutional: No fevers, chills, sweats, or change in appetite.  He has fatigue.  He has insomnia. Eyes: No visual changes, double vision, eye pain Ear, nose and throat: No hearing loss, ear pain, nasal congestion, sore throat Cardiovascular: No chest pain, palpitations Respiratory:  No shortness of breath at rest or with exertion.   No  wheezes GastrointestinaI: No nausea, vomiting, diarrhea, abdominal pain, fecal incontinence Genitourinary:  No dysuria, urinary retention or frequency.  No nocturia. Musculoskeletal: No neck pain.  He notes some back pain. Integumentary: No rash, pruritus, skin lesions Neurological: as above Psychiatric: No depression at this time.  No anxiety Endocrine: No palpitations, diaphoresis, change in appetite, change in weigh or increased thirst Hematologic/Lymphatic:  No anemia, purpura, petechiae. Allergic/Immunologic: No itchy/runny eyes, nasal congestion, recent allergic reactions, rashes  ALLERGIES: No Known Allergies  HOME MEDICATIONS:  Current Outpatient Medications:    baclofen (LIORESAL) 10 MG tablet, Take 1 tablet (10 mg total) by mouth 3 (three) times daily., Disp: 90 each, Rfl: 5   gabapentin (NEURONTIN) 600 MG tablet, Take 1 tablet (600 mg total) by mouth 3 (three) times daily., Disp: 90 tablet, Rfl: 5   predniSONE (DELTASONE) 50 MG tablet, 12 pills (600 mg) po qd x 3 days for MS exacerbation, Disp: 36 tablet, Rfl: 0   Vitamin D, Ergocalciferol, (DRISDOL) 1.25 MG (50000 UNIT) CAPS capsule, Take 1 capsule (50,000 Units total) by mouth every 7 (seven) days., Disp: 13 capsule, Rfl: 1  PAST MEDICAL HISTORY: Past Medical History:  Diagnosis Date   MS (multiple sclerosis) (HCC)     PAST SURGICAL HISTORY: History reviewed. No pertinent surgical history.  FAMILY HISTORY: History reviewed. No pertinent family history.  SOCIAL HISTORY: Social History   Socioeconomic History   Marital status: Single    Spouse name: Not on file   Number of children: 6   Years of education: Not on file   Highest education level: Not on file  Occupational History   Not on file  Tobacco Use   Smoking status: Former    Types: Cigarettes   Smokeless tobacco: Never  Vaping Use   Vaping Use: Never used  Substance and Sexual Activity   Alcohol use: Yes    Alcohol/week: 1.0 standard drink of  alcohol    Types: 1 Standard drinks or equivalent per week   Drug use: Yes    Frequency: 5.0 times per week    Types: Marijuana   Sexual activity: Yes    Birth control/protection: None  Other Topics Concern   Not on file  Social History Narrative   Not on file   Social Determinants of Health   Financial Resource Strain: Not on file  Food Insecurity: Not on file  Transportation Needs: Not on file  Physical Activity: Not on file  Stress: Not on file  Social Connections: Not on file  Intimate Partner Violence: Not on file       PHYSICAL EXAM  Vitals:   05/11/23 0938  BP: (!) 140/75  Pulse: 76  Weight: 166 lb (75.3 kg)  Height: 5' 9.5" (1.765 m)    Body mass index is 24.16 kg/m.    General: The patient is well-developed and well-nourished and in no acute distress  HEENT:  Head is Hawkins/AT.  Sclera are anicteric.   Skin: Extremities are without rash or  edema.  Musculoskeletal: Lumbar paraspinals are nontender.  However, he is tender over the trochanteric bursae, left greater than right.  Neurologic Exam  Mental status: The patient is alert and oriented x 3 at the time of the examination. The patient has apparent normal recent and remote memory, with an apparently normal attention span and concentration ability.   Speech is normal.  Cranial nerves: Extraocular movements are full. Pupils are equal, round, and reactive to light and accomodation.  Visual fields are full though notes acuity is slightly better OS.  Colors are symmetric.    Facial symmetry is present. There is good facial sensation to soft touch bilaterally.Facial strength is normal.  Trapezius and sternocleidomastoid strength is normal. No dysarthria is noted.  No obvious hearing deficits are noted.  Motor:  Muscle bulk is normal.   Tone is normal. Strength is  5 / 5 in all 4 extremities.   Sensory: Sensory testing  shows reduced temperature sensation on the left(new) and reduced vibration sensation in  right leg.    Coordination: Cerebellar testing reveals good finger-nose-finger and slightly reduced heel-to-shin bilaterally.  Gait and station: Station is normal.   Gait is slightly wide.  Tandem gait is wide.  Romberg is negative.   Reflexes: Deep tendon reflexes are symmetric and normal in the arms and increased at the knees with spread.  No ankle clonus.     DIAGNOSTIC DATA (LABS, IMAGING, TESTING) - I reviewed patient records, labs, notes, testing and imaging myself where available.  Lab Results  Component Value Date   WBC 4.7 03/28/2023   HGB 14.1 03/28/2023   HCT 42.3 03/28/2023   MCV 91 03/28/2023   PLT 258 03/28/2023      Component Value Date/Time   NA 139 10/17/2022 1417   K 4.3 10/17/2022 1417   CL 99 10/17/2022 1417   CO2 28 10/17/2022 1417   GLUCOSE 83 10/17/2022 1417   GLUCOSE 76 02/15/2022 0946   BUN 11 10/17/2022 1417   CREATININE 1.02 10/17/2022 1417   CALCIUM 10.4 (H) 10/17/2022 1417   PROT 7.4 10/17/2022 1417   ALBUMIN 5.0 10/17/2022 1417   AST 16 10/17/2022 1417   ALT 9 10/17/2022 1417   ALKPHOS 62 10/17/2022 1417   BILITOT 0.9 10/17/2022 1417   GFRNONAA >60 02/15/2022 0946       ASSESSMENT AND PLAN  Multiple sclerosis (HCC)  High risk medication use  Gait disturbance  Dysesthesia  Other fatigue  Switch to [name] --- they are supposed to be shipping so should take 1st shot within a week He is having a small exacerbation.  The balance is a little bit worse.  Unclear if the pain is related to MS or musculoskeletal.  IV Solumedrol one g x 1 d followed by high dose pills.   Increase gabapentin Rtc 6 months or sooner if new or worsening issues  Anitria Andon A. Epimenio Foot, MD, [facility] 05/11/2023, 10:44 AM Certified in Neurology, Clinical Neurophysiology, Sleep Medicine and Neuroimaging  [facility] [address] [place], [place] [address] [phone]

## 2023-05-11 NOTE — Telephone Encounter (Signed)
Answering service received call "Legs feel heavy from [name] with the patient, Alvin Williams.  I called [phone] snd spoke to her an patient:  More leg pain, worse gait, reduced appetite,  I will see around 930 today

## 2023-05-11 NOTE — Telephone Encounter (Signed)
Added to scheduled as requested

## 2023-05-17 ENCOUNTER — Ambulatory Visit: Payer: Medicaid Other

## 2023-05-21 ENCOUNTER — Other Ambulatory Visit: Payer: Self-pay | Admitting: Neurology

## 2023-05-23 NOTE — Telephone Encounter (Addendum)
Called [facility] at [phone]. Automated system states [name] delivered to pt by [facility] 05/12/23. However, they needed further info to continue to process further refills. They connected me to rep, Shari Prows. He confirmed loading dose was shipped to the patient as per automated system.   Asked them to double check that they had maintenance rx on file. They confirmed they have maintenance rx on file as well.

## 2023-06-04 NOTE — Telephone Encounter (Signed)
I called patient to discuss if he is doing well on [name]. No answer, VM not set up on mobile number.

## 2023-06-15 ENCOUNTER — Telehealth: Payer: Self-pay | Admitting: Neurology

## 2023-06-15 NOTE — Telephone Encounter (Signed)
Pt called needing a refill on his  baclofen (LIORESAL) 10 MG tablet  gabapentin (NEURONTIN) 600 MG tablet Vitamin D, Ergocalciferol, (DRISDOL) 1.25 MG (50000 UNIT) CAPS capsule  [facility] [place].

## 2023-06-18 ENCOUNTER — Other Ambulatory Visit: Payer: Self-pay | Admitting: Neurology

## 2023-06-18 MED ORDER — BACLOFEN 10 MG PO TABS: 10.0000 mg | ORAL_TABLET | Freq: Three times a day (TID) | ORAL | 0 refills | Status: DC

## 2023-06-18 MED ORDER — GABAPENTIN 600 MG PO TABS: 600.0000 mg | ORAL_TABLET | Freq: Three times a day (TID) | ORAL | 0 refills | Status: DC

## 2023-06-18 NOTE — Telephone Encounter (Signed)
I called patient again to discuss if he is doing well on [name].  No answer, voicemail not set up on mobile number.

## 2023-06-18 NOTE — Telephone Encounter (Signed)
Pt last seen on 05/11/23 Follow up scheduled on 09/27/23 Baclofen 10 mg last filled on 04/04/23 #270 tablets (90 day supply) Gabapentin 600 mg last filled on 05/11/23 #90 tablets (30 day supply) Vitamin d 50,000 units 05/06/23 #4 tablets (27 day supply) filled by PCP Beryle Beams, MD

## 2023-06-18 NOTE — Telephone Encounter (Signed)
Rx filled today this is duplicate refill request.

## 2023-06-26 NOTE — Telephone Encounter (Signed)
I called patient again to discuss if he is doing well on [name]. No answer, voicemail not set up yet.  I called patient's girlfriend, Baron Sane, per DPR. She reports that patient is doing well on [name]. I reminded her of patient's appointment in October. She verbalized understanding.

## 2023-07-04 DIAGNOSIS — R5381 Other malaise: Secondary | ICD-10-CM | POA: Diagnosis not present

## 2023-07-04 DIAGNOSIS — R531 Weakness: Secondary | ICD-10-CM | POA: Diagnosis not present

## 2023-07-10 ENCOUNTER — Telehealth: Payer: Self-pay | Admitting: Neurology

## 2023-07-10 MED ORDER — GABAPENTIN 600 MG PO TABS: 600.0000 mg | ORAL_TABLET | Freq: Three times a day (TID) | ORAL | 0 refills | Status: DC

## 2023-07-10 MED ORDER — VITAMIN D (ERGOCALCIFEROL) 1.25 MG (50000 UNIT) PO CAPS: 50000.0000 [IU] | ORAL_CAPSULE | ORAL | 1 refills | Status: DC

## 2023-07-10 NOTE — Telephone Encounter (Signed)
Routing to phone room to notify pt refill sent

## 2023-07-10 NOTE — Telephone Encounter (Signed)
Pt request refill for Vitamin D, Ergocalciferol, (DRISDOL) 1.25 MG (50000 UNIT) CAPS capsule  and  gabapentin (NEURONTIN) [facility] MG [facility] [phone]

## 2023-07-17 ENCOUNTER — Telehealth: Payer: Self-pay | Admitting: Neurology

## 2023-07-17 MED ORDER — KESIMPTA 20 MG/0.4ML ~~LOC~~ SOAJ: 20.0000 mg | SUBCUTANEOUS | 5 refills | Status: DC

## 2023-07-17 NOTE — Telephone Encounter (Signed)
Pt is requesting a refill for Ofatumumab ([name]) 20 MG/0.4ML SOAJ   .  Pharmacy: [facility]

## 2023-07-17 NOTE — Telephone Encounter (Signed)
Placed prescription to [facility].

## 2023-07-25 ENCOUNTER — Telehealth: Payer: Self-pay | Admitting: Neurology

## 2023-07-25 DIAGNOSIS — R208 Other disturbances of skin sensation: Secondary | ICD-10-CM

## 2023-07-25 DIAGNOSIS — R5383 Other fatigue: Secondary | ICD-10-CM

## 2023-07-25 DIAGNOSIS — G4700 Insomnia, unspecified: Secondary | ICD-10-CM

## 2023-07-25 DIAGNOSIS — E559 Vitamin D deficiency, unspecified: Secondary | ICD-10-CM

## 2023-07-25 DIAGNOSIS — G35 Multiple sclerosis: Secondary | ICD-10-CM

## 2023-07-25 DIAGNOSIS — Z79899 Other long term (current) drug therapy: Secondary | ICD-10-CM

## 2023-07-25 DIAGNOSIS — R269 Unspecified abnormalities of gait and mobility: Secondary | ICD-10-CM

## 2023-07-25 DIAGNOSIS — G35D Multiple sclerosis, unspecified: Secondary | ICD-10-CM

## 2023-07-25 NOTE — Telephone Encounter (Signed)
Pt reports that his mail order treatment is late, he is asking for a call to arrange a steroid shot

## 2023-07-30 NOTE — Telephone Encounter (Signed)
I called [facility]. I spoke with Alcario Drought. They are still processing the [name] claim but will expedite this. They did not mention needing a PCP to me. This was a 16 minute phone call.  I called patient. I spoke with Baron Sane, patient's girlfriend, per DPR. I advised her that [facility] is still processing his [name] but if a PCP is needed, it would be a good idea all around to go ahead and get set up with one. Baron Sane agrees with this and would like a referral for patient. When this is completed, please call patient at [phone].

## 2023-07-30 NOTE — Telephone Encounter (Signed)
Pt said, [facility] said cannot fill the refill because do not have a PCP.

## 2023-07-30 NOTE — Addendum Note (Signed)
Addended by: Geronimo Running A on: 07/30/2023 11:52 AM   Modules accepted: Orders

## 2023-08-01 ENCOUNTER — Telehealth: Payer: Self-pay | Admitting: Neurology

## 2023-08-01 NOTE — Telephone Encounter (Signed)
Referral primary care fax to [facility]. Phone: [phone], Fax: [phone]

## 2023-08-07 ENCOUNTER — Ambulatory Visit: Payer: Medicaid Other | Admitting: Nurse Practitioner

## 2023-08-07 ENCOUNTER — Encounter: Payer: Self-pay | Admitting: Nurse Practitioner

## 2023-08-07 VITALS — BP 100/60 | HR 79 | Temp 98.0°F | Ht 69.5 in | Wt 161.6 lb

## 2023-08-07 DIAGNOSIS — Z0001 Encounter for general adult medical examination with abnormal findings: Secondary | ICD-10-CM

## 2023-08-07 DIAGNOSIS — Z23 Encounter for immunization: Secondary | ICD-10-CM

## 2023-08-07 DIAGNOSIS — F331 Major depressive disorder, recurrent, moderate: Secondary | ICD-10-CM | POA: Diagnosis not present

## 2023-08-07 DIAGNOSIS — Z0000 Encounter for general adult medical examination without abnormal findings: Secondary | ICD-10-CM

## 2023-08-07 NOTE — Progress Notes (Signed)
+  New Patient Office Visit  Subjective    Patient ID: Alvin Williams, male    DOB: 1986-06-20  Age: 37 y.o. MRN: [id number]  CC:  Chief Complaint  Patient presents with   Establish Care    HPI [name] is a 37 yrs old African American male presents to establish care. PMH history of MS  MS Hx of MS dx 5 yrs ago and currently under the care of a neurologist  THC multiple sclerosis (MS) five years ago, is currently managed with [name]. Reports ongoing management of their condition, with regular follow-up and monitoring. The patient notes that since starting [name] he experienced a reduction in disease activity and a stabilization of  symptoms. However, he continues to have intermittent episodes of fatigue and occasional numbness in his extremities.  No new significant neurological symptoms reported. The patient has been compliant with treatment regimen and is undergoing regular assessments to monitor for any potential side effects or complications associated withKesimpta   MDD new dx He reports dealing with a lot of stress " I have issues with the my children 's mothers". He is very guarded and didn't want to elaborate. Base on his PHQ 9 we discuss medication and referral to counseling. He wants to try counseling first before considering medications. Denies  SI/HI. He is maintaining full time employment. Endorses daily use of THC 3-4 blunts and has been smoking for years Denies the use of Tobacco, ETOH or illicit drugs     08/07/2023   10:14 AM  PHQ9 SCORE ONLY  PHQ-9 Total Score 15        Outpatient Encounter Medications as of 08/07/2023  Medication Sig   baclofen (LIORESAL) 10 MG tablet Take 1 tablet (10 mg total) by mouth 3 (three) times daily.   gabapentin (NEURONTIN) 600 MG tablet Take 1 tablet (600 mg total) by mouth 3 (three) times daily.   Ofatumumab ([name]) 20 MG/0.4ML SOAJ Inject 20 mg into the skin every 30 (thirty) days.   Vitamin D, Ergocalciferol,  (DRISDOL) 1.25 MG (50000 UNIT) CAPS capsule Take 1 capsule (50,000 Units total) by mouth every 7 (seven) days.   [DISCONTINUED] predniSONE (DELTASONE) 50 MG tablet 12 pills (600 mg) po qd x 3 days for MS exacerbation (Patient not taking: Reported on 08/07/2023)   No facility-administered encounter medications on file as of 08/07/2023.    Past Medical History:  Diagnosis Date   MS (multiple sclerosis) (HCC)     History reviewed. No pertinent surgical history.  Family History  Problem Relation Age of Onset   Parkinson's disease Maternal Grandmother     Social History   Socioeconomic History   Marital status: Single    Spouse name: Not on file   Number of children: 6   Years of education: Not on file   Highest education level: Not on file  Occupational History   Not on file  Tobacco Use   Smoking status: Former    Types: Cigarettes   Smokeless tobacco: Never  Vaping Use   Vaping status: Never Used  Substance and Sexual Activity   Alcohol use: Yes    Alcohol/week: 1.0 standard drink of alcohol    Types: 1 Standard drinks or equivalent per week   Drug use: Yes    Frequency: 5.0 times per week    Types: Marijuana   Sexual activity: Yes    Birth control/protection: None  Other Topics Concern   Not on file  Social History Narrative   Not on  file   Social Determinants of Health   Financial Resource Strain: Low Risk  (08/07/2023)   Overall Financial Resource Strain (CARDIA)    Difficulty of Paying Living Expenses: Not hard at all  Food Insecurity: No Food Insecurity (08/07/2023)   Hunger Vital Sign    Worried About Running Out of Food in the Last Year: Never true    Ran Out of Food in the Last Year: Never true  Transportation Needs: No Transportation Needs (08/07/2023)   PRAPARE - Administrator, Civil Service (Medical): No    Lack of Transportation (Non-Medical): No  Physical Activity: Not on file  Stress: Not on file  Social Connections: Not on file   Intimate Partner Violence: Not At Risk (08/07/2023)   Humiliation, Afraid, Rape, and Kick questionnaire    Fear of Current or Ex-Partner: No    Emotionally Abused: No    Physically Abused: No    Sexually Abused: No    Review of Systems  Constitutional:  Negative for chills and fever.  Eyes:  Negative for pain and discharge.  Gastrointestinal:  Negative for blood in stool, melena, nausea and vomiting.  Musculoskeletal:  Positive for joint pain and myalgias.       Dx MS  Skin:  Negative for itching and rash.  Neurological:  Negative for dizziness.  Endo/Heme/Allergies:  Negative for environmental allergies and polydipsia. Does not bruise/bleed easily.  Psychiatric/Behavioral:  Positive for depression and substance abuse. The patient does not have insomnia.        THC daily   Negative unless indicated in HPI   Objective    BP 100/60   Pulse 79   Temp 98 F (36.7 C) (Temporal)   Ht 5' 9.5" (1.765 m)   Wt 161 lb 9.6 oz (73.3 kg)   SpO2 96%   BMI 23.52 kg/m   Physical Exam Vitals and nursing note reviewed.  Constitutional:      Appearance: Normal appearance. He is normal weight.  HENT:     Head: Normocephalic and atraumatic.  Eyes:     General: No scleral icterus.    Extraocular Movements: Extraocular movements intact.     Conjunctiva/sclera: Conjunctivae normal.     Pupils: Pupils are equal, round, and reactive to light.  Cardiovascular:     Rate and Rhythm: Normal rate and regular rhythm.  Pulmonary:     Effort: Pulmonary effort is normal.     Breath sounds: Normal breath sounds.  Neurological:     Mental Status: He is alert.  Psychiatric:        Attention and Perception: Attention and perception normal.        Mood and Affect: Mood is depressed.        Speech: Speech normal.        Behavior: Behavior normal. Behavior is cooperative.        Thought Content: Thought content normal. Thought content does not include homicidal or suicidal ideation. Thought content  does not include homicidal or suicidal plan.        Cognition and Memory: Cognition and memory normal.        Judgment: Judgment normal.     Comments: He was very guarded     Last CBC Lab Results  Component Value Date   WBC 4.7 03/28/2023   HGB 14.1 03/28/2023   HCT 42.3 03/28/2023   MCV 91 03/28/2023   MCH 30.3 03/28/2023   RDW 12.4 03/28/2023   PLT 258 03/28/2023   Last  metabolic panel Lab Results  Component Value Date   GLUCOSE 83 10/17/2022   NA 139 10/17/2022   K 4.3 10/17/2022   CL 99 10/17/2022   CO2 28 10/17/2022   BUN 11 10/17/2022   CREATININE 1.02 10/17/2022   EGFR 99 10/17/2022   CALCIUM 10.4 (H) 10/17/2022   PROT 7.4 10/17/2022   ALBUMIN 5.0 10/17/2022   LABGLOB 2.4 10/17/2022   AGRATIO 2.1 10/17/2022   BILITOT 0.9 10/17/2022   ALKPHOS 62 10/17/2022   AST 16 10/17/2022   ALT 9 10/17/2022   ANIONGAP 8 02/15/2022    Assessment & Plan:  Routine medical exam -     CBC with Differential/Platelet -     CMP14+EGFR -     Lipid panel -     Thyroid Panel With TSH -     HepB+HepC+HIV Panel  Moderate episode of recurrent major depressive disorder (HCC) -     Amb ref to Integrated [facility]  Need for Tdap vaccination -     Tdap vaccine greater than or equal to 7yo IM  Padraig was seen today to establish care, no acute distress MDD: new dx referral to behavioral health MS: under the care of neuro currently on [name] Labs: CBC, CMP, Lipid, TSH   Continue healthy lifestyle choices, including diet (rich in fruits, vegetables, and lean proteins, and low in salt and simple carbohydrates) and exercise (at least 30 minutes of moderate physical activity daily).     The above assessment and management plan was discussed with the patient. The patient verbalized understanding of and has agreed to the management plan. Patient is aware to call the clinic if they develop any new symptoms or if symptoms persist or worsen. Patient is aware when to return to the  clinic for a follow-up visit. Patient educated on when it is appropriate to go to the emergency department.   Return in about 6 months (around 02/07/2024).   Arrie Aran Santa Lighter, DNP [facility] [address] [place], [place] [address] [phone]

## 2023-08-08 ENCOUNTER — Telehealth: Payer: Self-pay | Admitting: Nurse Practitioner

## 2023-08-08 LAB — CMP14+EGFR
ALT: 12 IU/L (ref 0–44)
AST: 21 IU/L (ref 0–40)
Albumin: 4.7 g/dL (ref 4.1–5.1)
Alkaline Phosphatase: 61 IU/L (ref 44–121)
BUN/Creatinine Ratio: 10 (ref 9–20)
BUN: 13 mg/dL (ref 6–20)
Bilirubin Total: 1 mg/dL (ref 0.0–1.2)
CO2: 25 mmol/L (ref 20–29)
Calcium: 10.1 mg/dL (ref 8.7–10.2)
Chloride: 100 mmol/L (ref 96–106)
Creatinine, Ser: 1.29 mg/dL — ABNORMAL HIGH (ref 0.76–1.27)
Globulin, Total: 2.2 g/dL (ref 1.5–4.5)
Glucose: 79 mg/dL (ref 70–99)
Potassium: 4.7 mmol/L (ref 3.5–5.2)
Sodium: 138 mmol/L (ref 134–144)
Total Protein: 6.9 g/dL (ref 6.0–8.5)
eGFR: 75 mL/min/1.73 (ref >59)

## 2023-08-08 LAB — CBC WITH DIFFERENTIAL/PLATELET
Basophils Absolute: 0 x10E3/uL (ref 0.0–0.2)
Basos: 0 %
EOS (ABSOLUTE): 0.1 x10E3/uL (ref 0.0–0.4)
Eos: 3 %
Hematocrit: 43.8 % (ref 37.5–51.0)
Hemoglobin: 14.5 g/dL (ref 13.0–17.7)
Immature Grans (Abs): 0 x10E3/uL (ref 0.0–0.1)
Immature Granulocytes: 0 %
Lymphocytes Absolute: 1.5 x10E3/uL (ref 0.7–3.1)
Lymphs: 34 %
MCH: 29.8 pg (ref 26.6–33.0)
MCHC: 33.1 g/dL (ref 31.5–35.7)
MCV: 90 fL (ref 79–97)
Monocytes Absolute: 0.5 x10E3/uL (ref 0.1–0.9)
Monocytes: 12 %
Neutrophils Absolute: 2.2 x10E3/uL (ref 1.4–7.0)
Neutrophils: 51 %
Platelets: 231 x10E3/uL (ref 150–450)
RBC: 4.86 x10E6/uL (ref 4.14–5.80)
RDW: 12.9 % (ref 11.6–15.4)
WBC: 4.4 x10E3/uL (ref 3.4–10.8)

## 2023-08-08 LAB — HEPB+HEPC+HIV PANEL
HIV Screen 4th Generation wRfx: NONREACTIVE
Hep B C IgM: NEGATIVE
Hep B Core Total Ab: NEGATIVE
Hep B E Ab: NONREACTIVE
Hep B E Ag: NEGATIVE
Hep B Surface Ab, Qual: REACTIVE
Hep C Virus Ab: NONREACTIVE
Hepatitis B Surface Ag: NEGATIVE

## 2023-08-08 LAB — LIPID PANEL
Chol/HDL Ratio: 3 ratio (ref 0.0–5.0)
Cholesterol, Total: 224 mg/dL — ABNORMAL HIGH (ref 100–199)
HDL: 74 mg/dL (ref >39)
LDL Chol Calc (NIH): 138 mg/dL — ABNORMAL HIGH (ref 0–99)
Triglycerides: 69 mg/dL (ref 0–149)
VLDL Cholesterol Cal: 12 mg/dL (ref 5–40)

## 2023-08-08 LAB — THYROID PANEL WITH TSH
Free Thyroxine Index: 1.5 (ref 1.2–4.9)
T3 Uptake Ratio: 26 % (ref 24–39)
T4, Total: 5.9 ug/dL (ref 4.5–12.0)
TSH: 0.648 u[IU]/mL (ref 0.450–4.500)

## 2023-08-08 NOTE — Telephone Encounter (Signed)
Pt aware and verbalized understanding of lab results. Pt requested an appt to discuss shoulder and arm injuries. Appt made for pt.

## 2023-08-18 NOTE — Progress Notes (Deleted)
Established Patient Office Visit  Subjective   Patient ID: Alvin Williams, male    DOB: 10/26/1986  Age: 37 y.o. MRN: [id number]  No chief complaint on file.   HPI  Patient Active Problem List   Diagnosis Date Noted   Moderate episode of recurrent major depressive disorder (HCC) 08/07/2023   Routine medical exam 08/07/2023   Multiple sclerosis (HCC) 07/11/2022   Past Medical History:  Diagnosis Date   MS (multiple sclerosis) (HCC)    No past surgical history on file. Social History   Tobacco Use   Smoking status: Former    Types: Cigarettes   Smokeless tobacco: Never  Vaping Use   Vaping status: Never Used  Substance Use Topics   Alcohol use: Yes    Alcohol/week: 1.0 standard drink of alcohol    Types: 1 Standard drinks or equivalent per week   Drug use: Yes    Frequency: 5.0 times per week    Types: Marijuana   Social History   Socioeconomic History   Marital status: Single    Spouse name: Not on file   Number of children: 6   Years of education: Not on file   Highest education level: Not on file  Occupational History   Not on file  Tobacco Use   Smoking status: Former    Types: Cigarettes   Smokeless tobacco: Never  Vaping Use   Vaping status: Never Used  Substance and Sexual Activity   Alcohol use: Yes    Alcohol/week: 1.0 standard drink of alcohol    Types: 1 Standard drinks or equivalent per week   Drug use: Yes    Frequency: 5.0 times per week    Types: Marijuana   Sexual activity: Yes    Birth control/protection: None  Other Topics Concern   Not on file  Social History Narrative   Not on file   Social Determinants of Health   Financial Resource Strain: Low Risk  (08/07/2023)   Overall Financial Resource Strain (CARDIA)    Difficulty of Paying Living Expenses: Not hard at all  Food Insecurity: No Food Insecurity (08/07/2023)   Hunger Vital Sign    Worried About Running Out of Food in the Last Year: Never true    Ran Out of Food in the  Last Year: Never true  Transportation Needs: No Transportation Needs (08/07/2023)   PRAPARE - Administrator, Civil Service (Medical): No    Lack of Transportation (Non-Medical): No  Physical Activity: Not on file  Stress: Not on file  Social Connections: Not on file  Intimate Partner Violence: Not At Risk (08/07/2023)   Humiliation, Afraid, Rape, and Kick questionnaire    Fear of Current or Ex-Partner: No    Emotionally Abused: No    Physically Abused: No    Sexually Abused: No   Family Status  Relation Name Status   Mother  Alive   Father  Alive   MGM  Deceased   MGF  Deceased  No partnership data on file   Family History  Problem Relation Age of Onset   Parkinson's disease Maternal Grandmother    No Known Allergies    ROS Negative unless indicated in HPI   Objective:     There were no vitals taken for this visit. BP Readings from Last 3 Encounters:  08/07/23 100/60  05/11/23 (!) 140/75  03/28/23 (!) 95/44   Wt Readings from Last 3 Encounters:  08/07/23 161 lb 9.6 oz (73.3 kg)  05/11/23  166 lb (75.3 kg)  03/28/23 180 lb 12.8 oz (82 kg)      Physical Exam   No results found for any visits on 08/23/23.  Last CBC Lab Results  Component Value Date   WBC 4.4 08/07/2023   HGB 14.5 08/07/2023   HCT 43.8 08/07/2023   MCV 90 08/07/2023   MCH 29.8 08/07/2023   RDW 12.9 08/07/2023   PLT 231 08/07/2023   Last metabolic panel Lab Results  Component Value Date   GLUCOSE 79 08/07/2023   NA 138 08/07/2023   K 4.7 08/07/2023   CL 100 08/07/2023   CO2 25 08/07/2023   BUN 13 08/07/2023   CREATININE 1.29 (H) 08/07/2023   EGFR 75 08/07/2023   CALCIUM 10.1 08/07/2023   PROT 6.9 08/07/2023   ALBUMIN 4.7 08/07/2023   LABGLOB 2.2 08/07/2023   AGRATIO 2.1 10/17/2022   BILITOT 1.0 08/07/2023   ALKPHOS 61 08/07/2023   AST 21 08/07/2023   ALT 12 08/07/2023   ANIONGAP 8 02/15/2022   Last lipids Lab Results  Component Value Date   CHOL 224 (H)  08/07/2023   HDL 74 08/07/2023   LDLCALC 138 (H) 08/07/2023   TRIG 69 08/07/2023   CHOLHDL 3.0 08/07/2023        Assessment & Plan:  There are no diagnoses linked to this encounter.  No follow-ups on file.    Continue healthy lifestyle choices, including diet (rich in fruits, vegetables, and lean proteins, and low in salt and simple carbohydrates) and exercise (at least 30 minutes of moderate physical activity daily).     The above assessment and management plan was discussed with the patient. The patient verbalized understanding of and has agreed to the management plan. Patient is aware to call the clinic if they develop any new symptoms or if symptoms persist or worsen. Patient is aware when to return to the clinic for a follow-up visit. Patient educated on when it is appropriate to go to the emergency department.

## 2023-08-23 ENCOUNTER — Ambulatory Visit: Payer: Medicaid Other | Admitting: Nurse Practitioner

## 2023-08-23 NOTE — Progress Notes (Deleted)
Established Patient Office Visit  Subjective   Patient ID: Jerimi Write, male    DOB: [birth year]  Age: 37 y.o. MRN: [id number]  No chief complaint on file.   HPI  Patient Active Problem List   Diagnosis Date Noted   Moderate episode of recurrent major depressive disorder (HCC) 08/07/2023   Routine medical exam 08/07/2023   Multiple sclerosis (HCC) 07/11/2022   Past Medical History:  Diagnosis Date   MS (multiple sclerosis) (HCC)    No past surgical history on file. Social History   Tobacco Use   Smoking status: Former    Types: Cigarettes   Smokeless tobacco: Never  Vaping Use   Vaping status: Never Used  Substance Use Topics   Alcohol use: Yes    Alcohol/week: 1.0 standard drink of alcohol    Types: 1 Standard drinks or equivalent per week   Drug use: Yes    Frequency: 5.0 times per week    Types: Marijuana   Social History   Socioeconomic History   Marital status: Single    Spouse name: Not on file   Number of children: 6   Years of education: Not on file   Highest education level: Not on file  Occupational History   Not on file  Tobacco Use   Smoking status: Former    Types: Cigarettes   Smokeless tobacco: Never  Vaping Use   Vaping status: Never Used  Substance and Sexual Activity   Alcohol use: Yes    Alcohol/week: 1.0 standard drink of alcohol    Types: 1 Standard drinks or equivalent per week   Drug use: Yes    Frequency: 5.0 times per week    Types: Marijuana   Sexual activity: Yes    Birth control/protection: None  Other Topics Concern   Not on file  Social History Narrative   Not on file   Social Determinants of Health   Financial Resource Strain: Low Risk  (08/07/2023)   Overall Financial Resource Strain (CARDIA)    Difficulty of Paying Living Expenses: Not hard at all  Food Insecurity: No Food Insecurity (08/07/2023)   Hunger Vital Sign    Worried About Running Out of Food in the Last Year: Never true    Ran Out of Food in the  Last Year: Never true  Transportation Needs: No Transportation Needs (08/07/2023)   PRAPARE - Administrator, Civil Service (Medical): No    Lack of Transportation (Non-Medical): No  Physical Activity: Not on file  Stress: Not on file  Social Connections: Not on file  Intimate Partner Violence: Not At Risk (08/07/2023)   Humiliation, Afraid, Rape, and Kick questionnaire    Fear of Current or Ex-Partner: No    Emotionally Abused: No    Physically Abused: No    Sexually Abused: No   Family Status  Relation Name Status   Mother  Alive   Father  Alive   MGM  Deceased   MGF  Deceased  No partnership data on file   Family History  Problem Relation Age of Onset   Parkinson's disease Maternal Grandmother    No Known Allergies    ROS Negative unless indicated in HPI   Objective:     There were no vitals taken for this visit. BP Readings from Last 3 Encounters:  08/07/23 100/60  05/11/23 (!) 140/75  03/28/23 (!) 95/44   Wt Readings from Last 3 Encounters:  08/07/23 161 lb 9.6 oz (73.3 kg)  05/11/23  166 lb (75.3 kg)  03/28/23 180 lb 12.8 oz (82 kg)      Physical Exam   No results found for any visits on 08/23/23.  Last CBC Lab Results  Component Value Date   WBC 4.4 08/07/2023   HGB 14.5 08/07/2023   HCT 43.8 08/07/2023   MCV 90 08/07/2023   MCH 29.8 08/07/2023   RDW 12.9 08/07/2023   PLT 231 08/07/2023   Last metabolic panel Lab Results  Component Value Date   GLUCOSE 79 08/07/2023   NA 138 08/07/2023   K 4.7 08/07/2023   CL 100 08/07/2023   CO2 25 08/07/2023   BUN 13 08/07/2023   CREATININE 1.29 (H) 08/07/2023   EGFR 75 08/07/2023   CALCIUM 10.1 08/07/2023   PROT 6.9 08/07/2023   ALBUMIN 4.7 08/07/2023   LABGLOB 2.2 08/07/2023   AGRATIO 2.1 10/17/2022   BILITOT 1.0 08/07/2023   ALKPHOS 61 08/07/2023   AST 21 08/07/2023   ALT 12 08/07/2023   ANIONGAP 8 02/15/2022   Last lipids Lab Results  Component Value Date   CHOL 224 (H)  08/07/2023   HDL 74 08/07/2023   LDLCALC 138 (H) 08/07/2023   TRIG 69 08/07/2023   CHOLHDL 3.0 08/07/2023        Assessment & Plan:  There are no diagnoses linked to this encounter.  No follow-ups on file.    @Ryiah Bellissimo  [place], [place]

## 2023-09-03 ENCOUNTER — Encounter: Payer: Medicaid Other | Admitting: Professional Counselor

## 2023-09-11 ENCOUNTER — Telehealth: Payer: Self-pay | Admitting: Neurology

## 2023-09-11 MED ORDER — BACLOFEN 10 MG PO TABS: 10.0000 mg | ORAL_TABLET | Freq: Three times a day (TID) | ORAL | 0 refills | Status: DC

## 2023-09-11 MED ORDER — GABAPENTIN 600 MG PO TABS: 600.0000 mg | ORAL_TABLET | Freq: Three times a day (TID) | ORAL | 0 refills | Status: DC

## 2023-09-11 NOTE — Telephone Encounter (Signed)
Pt is requesting a refill for gabapentin (NEURONTIN) 600 MG tablet, baclofen (LIORESAL) 10 MG tablet   And Vitamin D, Ergocalciferol, (DRISDOL) 1.25 MG (50000 UNIT) CAPS capsule .  Pharmacy: [facility] [phone]

## 2023-09-11 NOTE — Telephone Encounter (Signed)
Pt ahs appt 09/27/2023 with Anderson Malta, NP , has a refill on vit D. Already.  Filled the gabapentin and baclofen for one month

## 2023-09-27 ENCOUNTER — Ambulatory Visit: Payer: Medicaid Other | Admitting: Adult Health

## 2023-09-27 NOTE — Progress Notes (Deleted)
[facility]  PATIENT: Alvin Williams DOB: 1986-12-07  REFERRING DOCTOR OR PCP: Beryle Beams MD SOURCE: Patient, notes from Dr. Gerilyn Pilgrim, laboratory reports, MRI reports, MRI images personally reviewed.  _________________________________   HISTORICAL  CHIEF COMPLAINT:  No chief complaint on file.   HISTORY OF PRESENT ILLNESS:  Alvin Williams is a 37 y.o. man who was diagnosed with MS in 2020.    Update 09/27/2023: Previously seen by Dr. Epimenio Foot on 05/10/2024.  At prior visit, recommended switch from Tysabri to [facility] ***.   I spoke to him by phone earlier this morning.  Because I am concerned that he could be having an exacerbation we scheduled this urgent visit for further evaluation.  For a week he has had much more pain in the left > right thighs.   He also has had more tingling in his arms and hands.  H notes his gait is worse.    He is not sure if gabapentin 400 mg po tid has helped.  He tolerates it well.       He was on Tysabri with a low positive titer (0.88 on 12/13/2022).  He takes Tysabri every 6 weeks.   He has been feeling much worse since changing from 4 weeks to 6 weeks.    He wished to change medications at the last visit.  Last infusion was late January 2024.  We are trying to get him [name]  Currently, he notes his gait is off balanced.  He had 1 fall.   He uses the bannister on stairs.  He has pain in both legs., left > right.  He notes some leg numbness.   He notes spasticity in leg muscles. Sometimes uncomfortable,   He also notes milder arm pain and numbness   He will sometimes drop items.     Bladder function is fine.    He denies any issues with his vision currently.but had nystagmus at the time of diagnosis.    He notes reduced memory and focus.  He has some depression and anxiety.   He feels he sleeps well most nights.  No snoring.   He notes fatigue.  He is otherwise in good health.   He is not able to work out as much as he used to.      MS HISTORY In 2019 he had an episode of numbness and pain in the legs x 2 weeks.  One month later, he had symptoms in the left hand x 1 month.   A couple months later, while at work he noted vision was blurry when he would turn his head for a few seconds at a time and his vision was jerking.     He was referred to Dr, Gerilyn Pilgrim and then had MRI's performed that were consistent with MS.  Marland Kitchen   He was placed on Tysabri and continues on it.    He has done well with no exacerbation.    He has been doing infusions at [facility].    Last infusion was 2 weeks ago.   By report, he is JCV positive and the Tysabri was stretched to every 6 weeks.  I do not have the titer.    Imaging:  MRI of the brain 10/20/2021 showed multiple T2/FLAIR hyperintense foci in the cerebral hemispheres, predominantly in the periventricular white matter radially oriented to the ventricles.  Foci also noted in the right cerebellar hemisphere, and right posterior pons.    MRI of the cervical spine 10/20/2021 showed  T2 hyperintense foci within the spinal cord adjacent to C1-C2, adjacent to C3 centrally and towards the left, adjacent to C3 posterolaterally to the right (small), adjacent to C4-C5 posterolaterally to the right  MRI of the brain 05/16/2019 showed large enhancing lesions in the periventricular white matter of both posterior frontal/parietal lobes and another small T2 hyperintense foci in the periventricular white matter.    MRI of the cervical spine 05/16/2019 shows foci adjacent to C1-C2 and adjacent to C2-C3 (enhancing) and C5.  REVIEW OF SYSTEMS: Constitutional: No fevers, chills, sweats, or change in appetite.  He has fatigue.  He has insomnia. Eyes: No visual changes, double vision, eye pain Ear, nose and throat: No hearing loss, ear pain, nasal congestion, sore throat Cardiovascular: No chest pain, palpitations Respiratory:  No shortness of breath at rest or with exertion.   No wheezes GastrointestinaI: No  nausea, vomiting, diarrhea, abdominal pain, fecal incontinence Genitourinary:  No dysuria, urinary retention or frequency.  No nocturia. Musculoskeletal: No neck pain.  He notes some back pain. Integumentary: No rash, pruritus, skin lesions Neurological: as above Psychiatric: No depression at this time.  No anxiety Endocrine: No palpitations, diaphoresis, change in appetite, change in weigh or increased thirst Hematologic/Lymphatic:  No anemia, purpura, petechiae. Allergic/Immunologic: No itchy/runny eyes, nasal congestion, recent allergic reactions, rashes  ALLERGIES: No Known Allergies  HOME MEDICATIONS:  Current Outpatient Medications:    baclofen (LIORESAL) 10 MG tablet, Take 1 tablet (10 mg total) by mouth 3 (three) times daily., Disp: 90 each, Rfl: 0   gabapentin (NEURONTIN) 600 MG tablet, Take 1 tablet (600 mg total) by mouth 3 (three) times daily., Disp: 90 tablet, Rfl: 0   Ofatumumab ([name]) 20 MG/0.4ML SOAJ, Inject 20 mg into the skin every 30 (thirty) days., Disp: 0.56 mL, Rfl: 5   Vitamin D, Ergocalciferol, (DRISDOL) 1.25 MG (50000 UNIT) CAPS capsule, Take 1 capsule (50,000 Units total) by mouth every 7 (seven) days., Disp: 13 capsule, Rfl: 1  PAST MEDICAL HISTORY: Past Medical History:  Diagnosis Date   MS (multiple sclerosis) (HCC)     PAST SURGICAL HISTORY: No past surgical history on file.  FAMILY HISTORY: Family History  Problem Relation Age of Onset   Parkinson's disease Maternal Grandmother     SOCIAL HISTORY: Social History   Socioeconomic History   Marital status: Single    Spouse name: Not on file   Number of children: 6   Years of education: Not on file   Highest education level: Not on file  Occupational History   Not on file  Tobacco Use   Smoking status: Former    Types: Cigarettes   Smokeless tobacco: Never  Vaping Use   Vaping status: Never Used  Substance and Sexual Activity   Alcohol use: Yes    Alcohol/week: 1.0 standard drink  of alcohol    Types: 1 Standard drinks or equivalent per week   Drug use: Yes    Frequency: 5.0 times per week    Types: Marijuana   Sexual activity: Yes    Birth control/protection: None  Other Topics Concern   Not on file  Social History Narrative   Not on file   Social Determinants of Health   Financial Resource Strain: Low Risk  (08/07/2023)   Overall Financial Resource Strain (CARDIA)    Difficulty of Paying Living Expenses: Not hard at all  Food Insecurity: No Food Insecurity (08/07/2023)   Hunger Vital Sign    Worried About Running Out of Food in the  Last Year: Never true    Ran Out of Food in the Last Year: Never true  Transportation Needs: No Transportation Needs (08/07/2023)   PRAPARE - Administrator, Civil Service (Medical): No    Lack of Transportation (Non-Medical): No  Physical Activity: Not on file  Stress: Not on file  Social Connections: Not on file  Intimate Partner Violence: Not At Risk (08/07/2023)   Humiliation, Afraid, Rape, and Kick questionnaire    Fear of Current or Ex-Partner: No    Emotionally Abused: No    Physically Abused: No    Sexually Abused: No       PHYSICAL EXAM  There were no vitals filed for this visit.   There is no height or weight on file to calculate BMI.    General: The patient is well-developed and well-nourished and in no acute distress  HEENT:  Head is [place]/AT.  Sclera are anicteric.   Skin: Extremities are without rash or  edema.  Musculoskeletal: Lumbar paraspinals are nontender.  However, he is tender over the trochanteric bursae, left greater than right.  Neurologic Exam  Mental status: The patient is alert and oriented x 3 at the time of the examination. The patient has apparent normal recent and remote memory, with an apparently normal attention span and concentration ability.   Speech is normal.  Cranial nerves: Extraocular movements are full. Pupils are equal, round, and reactive to light and  accomodation.  Visual fields are full though notes acuity is slightly better OS.  Colors are symmetric.    Facial symmetry is present. There is good facial sensation to soft touch bilaterally.Facial strength is normal.  Trapezius and sternocleidomastoid strength is normal. No dysarthria is noted.  No obvious hearing deficits are noted.  Motor:  Muscle bulk is normal.   Tone is normal. Strength is  5 / 5 in all 4 extremities.   Sensory: Sensory testing  shows reduced temperature sensation on the left(new) and reduced vibration sensation in right leg.    Coordination: Cerebellar testing reveals good finger-nose-finger and slightly reduced heel-to-shin bilaterally.  Gait and station: Station is normal.   Gait is slightly wide.  Tandem gait is wide.  Romberg is negative.   Reflexes: Deep tendon reflexes are symmetric and normal in the arms and increased at the knees with spread.  No ankle clonus.     DIAGNOSTIC DATA (LABS, IMAGING, TESTING) - I reviewed patient records, labs, notes, testing and imaging myself where available.  Lab Results  Component Value Date   WBC 4.4 08/07/2023   HGB 14.5 08/07/2023   HCT 43.8 08/07/2023   MCV 90 08/07/2023   PLT 231 08/07/2023      Component Value Date/Time   NA 138 08/07/2023 1036   K 4.7 08/07/2023 1036   CL 100 08/07/2023 1036   CO2 25 08/07/2023 1036   GLUCOSE 79 08/07/2023 1036   GLUCOSE 76 02/15/2022 0946   BUN 13 08/07/2023 1036   CREATININE 1.29 (H) 08/07/2023 1036   CALCIUM 10.1 08/07/2023 1036   PROT 6.9 08/07/2023 1036   ALBUMIN 4.7 08/07/2023 1036   AST 21 08/07/2023 1036   ALT 12 08/07/2023 1036   ALKPHOS 61 08/07/2023 1036   BILITOT 1.0 08/07/2023 1036   GFRNONAA >60 02/15/2022 0946       ASSESSMENT AND PLAN  No diagnosis found.  Switch to [name] --- they are supposed to be shipping so should take 1st shot within a week He is having a  small exacerbation.  The balance is a little bit worse.  Unclear if the pain is  related to MS or musculoskeletal.  IV Solumedrol one g x 1 d followed by high dose pills.   Increase gabapentin Rtc 6 months or sooner if new or worsening issues     I spent *** minutes of face-to-face and non-face-to-face time with patient.  This included previsit chart review, lab review, study review, order entry, electronic health record documentation, patient education and discussion regarding above diagnoses and treatment plan and answered all other questions to patient's satisfaction  Ihor Austin, [facility]  [facility] [address] [place], [place] [address]  Phone [phone] Fax [phone] Note: This document was prepared with digital dictation and possible smart phrase technology. Any transcriptional errors that result from this process are unintentional.

## 2023-09-29 DIAGNOSIS — Z0130 Encounter for examination of blood pressure without abnormal findings: Secondary | ICD-10-CM | POA: Diagnosis not present

## 2023-09-29 DIAGNOSIS — M545 Low back pain, unspecified: Secondary | ICD-10-CM | POA: Diagnosis not present

## 2023-11-12 ENCOUNTER — Encounter: Payer: Self-pay | Admitting: Neurology

## 2023-11-12 ENCOUNTER — Ambulatory Visit (INDEPENDENT_AMBULATORY_CARE_PROVIDER_SITE_OTHER): Payer: Medicaid Other | Admitting: Neurology

## 2023-11-12 VITALS — BP 98/60 | HR 80 | Ht 69.0 in | Wt 161.5 lb

## 2023-11-12 DIAGNOSIS — Z79899 Other long term (current) drug therapy: Secondary | ICD-10-CM

## 2023-11-12 DIAGNOSIS — G35 Multiple sclerosis: Secondary | ICD-10-CM

## 2023-11-12 DIAGNOSIS — Z5181 Encounter for therapeutic drug level monitoring: Secondary | ICD-10-CM | POA: Diagnosis not present

## 2023-11-12 DIAGNOSIS — R269 Unspecified abnormalities of gait and mobility: Secondary | ICD-10-CM | POA: Diagnosis not present

## 2023-11-12 MED ORDER — ETODOLAC 400 MG PO TABS: 400.0000 mg | ORAL_TABLET | Freq: Two times a day (BID) | ORAL | 5 refills | Status: DC

## 2023-11-12 NOTE — Progress Notes (Signed)
[facility]  PATIENT: Alvin Williams DOB: [birth year]  REFERRING DOCTOR OR PCP: Beryle Beams MD SOURCE: Patient, notes from Dr. Gerilyn Pilgrim, laboratory reports, MRI reports, MRI images personally reviewed.  _________________________________   HISTORICAL  CHIEF COMPLAINT:  Chief Complaint  Patient presents with   Follow-up    Pt in room 11 alone. Here for MS follow up on [name]. Pt said legs still hurt takes gabapentin.     HISTORY OF PRESENT ILLNESS:  Alvin Williams is a 37 y.o. man who was diagnosed with MS in 2020.    Update 11/12/2023: He switched to Ksimpta (was on Tysabri) and tolerates it well.      He is reporting bilateral leg pain, L>R, and worse in the back of the upper legs.   His legs are a little stiff.   He is on baclofen 10 mg po tid and gabapentin 600 mg po tid.  He is not sure if gabapentin 400 mg po tid has helped.  He tolerates it well.      Currently, he notes his gait is off balanced.  He had 1 fall.   He uses the bannister on stairs.  He has pain in both legs., left > right.  He notes some leg numbness.   He notes spasticity in leg muscles  He also notes milder arm pain and numbness   He will sometimes drop items.     He has urinary frequency but no incontinence.    He denies any issues with his vision currently.Marland Kitchen  He had nystagmus at the time of diagnosis.    He notes reduced memory and focus.  He has some depression and anxiety.   He feels he sleeps well most nights.  No snoring.   He notes fatigue.  He is otherwise in good health.       MS HISTORY In 2019 he had an episode of numbness and pain in the legs x 2 weeks.  One month later, he had symptoms in the left hand x 1 month.   A couple months later, while at work he noted vision was blurry when he would turn his head for a few seconds at a time and his vision was jerking.     He was referred to Dr, Gerilyn Pilgrim and then had MRI's performed that were consistent with MS.  Marland Kitchen   He was placed  on Tysabri and continues on it.    He has done well with no exacerbation.    He has been doing infusions at [facility].    Last infusion was 2 weeks ago.   By report, he is JCV positive and the Tysabri was stretched to every 6 weeks.   He was on Tysabri with a low positive titer (0.88 on 12/13/2022) every 6 weeks.     Last infusion was late January 2024.  We switched to [name] in May 2024.   Imaging:  MRI of the brain 10/20/2021 showed multiple T2/FLAIR hyperintense foci in the cerebral hemispheres, predominantly in the periventricular white matter radially oriented to the ventricles.  Foci also noted in the right cerebellar hemisphere, and right posterior pons.    MRI of the cervical spine 10/20/2021 showed T2 hyperintense foci within the spinal cord adjacent to C1-C2, adjacent to C3 centrally and towards the left, adjacent to C3 posterolaterally to the right (small), adjacent to C4-C5 posterolaterally to the right  MRI of the brain 05/16/2019 showed large enhancing lesions in the periventricular white matter of both posterior  frontal/parietal lobes and another small T2 hyperintense foci in the periventricular white matter.    MRI of the cervical spine 05/16/2019 shows foci adjacent to C1-C2 and adjacent to C2-C3 (enhancing) and C5.  REVIEW OF SYSTEMS: Constitutional: No fevers, chills, sweats, or change in appetite.  He has fatigue.  He has insomnia. Eyes: No visual changes, double vision, eye pain Ear, nose and throat: No hearing loss, ear pain, nasal congestion, sore throat Cardiovascular: No chest pain, palpitations Respiratory:  No shortness of breath at rest or with exertion.   No wheezes GastrointestinaI: No nausea, vomiting, diarrhea, abdominal pain, fecal incontinence Genitourinary:  No dysuria, urinary retention or frequency.  No nocturia. Musculoskeletal: No neck pain.  He notes some back pain. Integumentary: No rash, pruritus, skin lesions Neurological: as above Psychiatric: No  depression at this time.  No anxiety Endocrine: No palpitations, diaphoresis, change in appetite, change in weigh or increased thirst Hematologic/Lymphatic:  No anemia, purpura, petechiae. Allergic/Immunologic: No itchy/runny eyes, nasal congestion, recent allergic reactions, rashes  ALLERGIES: No Known Allergies  HOME MEDICATIONS:  Current Outpatient Medications:    baclofen (LIORESAL) 10 MG tablet, Take 1 tablet (10 mg total) by mouth 3 (three) times daily., Disp: 90 each, Rfl: 0   etodolac (LODINE) 400 MG tablet, Take 1 tablet (400 mg total) by mouth 2 (two) times daily., Disp: 60 tablet, Rfl: 5   gabapentin (NEURONTIN) 600 MG tablet, Take 1 tablet (600 mg total) by mouth 3 (three) times daily., Disp: 90 tablet, Rfl: 0   Ofatumumab ([name]) 20 MG/0.4ML SOAJ, Inject 20 mg into the skin every 30 (thirty) days., Disp: 0.56 mL, Rfl: 5   Vitamin D, Ergocalciferol, (DRISDOL) 1.25 MG (50000 UNIT) CAPS capsule, Take 1 capsule (50,000 Units total) by mouth every 7 (seven) days., Disp: 13 capsule, Rfl: 1  PAST MEDICAL HISTORY: Past Medical History:  Diagnosis Date   MS (multiple sclerosis) (HCC)     PAST SURGICAL HISTORY: No past surgical history on file.  FAMILY HISTORY: Family History  Problem Relation Age of Onset   Parkinson's disease Maternal Grandmother     SOCIAL HISTORY: Social History   Socioeconomic History   Marital status: Single    Spouse name: Not on file   Number of children: 6   Years of education: Not on file   Highest education level: Not on file  Occupational History   Not on file  Tobacco Use   Smoking status: Former    Types: Cigarettes   Smokeless tobacco: Never  Vaping Use   Vaping status: Never Used  Substance and Sexual Activity   Alcohol use: Yes    Alcohol/week: 1.0 standard drink of alcohol    Types: 1 Standard drinks or equivalent per week   Drug use: Yes    Frequency: 5.0 times per week    Types: Marijuana   Sexual activity: Yes     Birth control/protection: None  Other Topics Concern   Not on file  Social History Narrative   Not on file   Social Determinants of Health   Financial Resource Strain: Low Risk  (08/07/2023)   Overall Financial Resource Strain (CARDIA)    Difficulty of Paying Living Expenses: Not hard at all  Food Insecurity: No Food Insecurity (08/07/2023)   Hunger Vital Sign    Worried About Running Out of Food in the Last Year: Never true    Ran Out of Food in the Last Year: Never true  Transportation Needs: No Transportation Needs (08/07/2023)   PRAPARE -  Administrator, Civil Service (Medical): No    Lack of Transportation (Non-Medical): No  Physical Activity: Not on file  Stress: Not on file  Social Connections: Not on file  Intimate Partner Violence: Not At Risk (08/07/2023)   Humiliation, Afraid, Rape, and Kick questionnaire    Fear of Current or Ex-Partner: No    Emotionally Abused: No    Physically Abused: No    Sexually Abused: No       PHYSICAL EXAM  Vitals:   11/12/23 0841  BP: 98/60  Pulse: 80  Weight: 161 lb 8 oz (73.3 kg)  Height: 5\' 9"  (1.753 m)    Body mass index is 23.85 kg/m.    General: The patient is well-developed and well-nourished and in no acute distress  HEENT:  Head is [place]/AT.  Sclera are anicteric.   Skin: Extremities are without rash or  edema.  Musculoskeletal: Lumbar paraspinals are nontender.  However, he is tender over the trochanteric bursae, left greater than right.  Neurologic Exam  Mental status: The patient is alert and oriented x 3 at the time of the examination. The patient has apparent normal recent and remote memory, with an apparently normal attention span and concentration ability.   Speech is normal.  Cranial nerves: Extraocular movements are full. Pupils are equal, round, and reactive to light and accomodation.  Visual fields are full though notes acuity is slightly better OS.  Colors are symmetric.    Facial symmetry is  present. There is good facial sensation to soft touch bilaterally.Facial strength is normal.  Trapezius and sternocleidomastoid strength is normal. No dysarthria is noted.  No obvious hearing deficits are noted.  Motor:  Muscle bulk is normal.   Tone is normal. Strength is  5 / 5 in all 4 extremities.   Sensory: Sensory testing  shows reduced temperature sensation on the left(new) and reduced vibration sensation in right leg.    Coordination: Cerebellar testing reveals good finger-nose-finger and slightly reduced heel-to-shin bilaterally.  Gait and station: Station is normal.   Gait is slightly wide.  Tandem gait is moderately wide for age.  Romberg is negative.   Reflexes: Deep tendon reflexes are symmetric and normal in the arms and increased at the knees with spread.  No ankle clonus.     DIAGNOSTIC DATA (LABS, IMAGING, TESTING) - I reviewed patient records, labs, notes, testing and imaging myself where available.  Lab Results  Component Value Date   WBC 4.4 08/07/2023   HGB 14.5 08/07/2023   HCT 43.8 08/07/2023   MCV 90 08/07/2023   PLT 231 08/07/2023      Component Value Date/Time   NA 138 08/07/2023 1036   K 4.7 08/07/2023 1036   CL 100 08/07/2023 1036   CO2 25 08/07/2023 1036   GLUCOSE 79 08/07/2023 1036   GLUCOSE 76 02/15/2022 0946   BUN 13 08/07/2023 1036   CREATININE 1.29 (H) 08/07/2023 1036   CALCIUM 10.1 08/07/2023 1036   PROT 6.9 08/07/2023 1036   ALBUMIN 4.7 08/07/2023 1036   AST 21 08/07/2023 1036   ALT 12 08/07/2023 1036   ALKPHOS 61 08/07/2023 1036   BILITOT 1.0 08/07/2023 1036   GFRNONAA >60 02/15/2022 0946       ASSESSMENT AND PLAN  Multiple sclerosis (HCC) - Plan: CD20 B Cells, IgG, IgA, IgM, MR BRAIN W WO CONTRAST  Encounter for monitoring immunomodulating therapy - Plan: CD20 B Cells, IgG, IgA, IgM  Gait disturbance - Plan: MR BRAIN W  WO CONTRAST  Continue [name]  Continue baclofen and gabapentin for MS related spasticity and  dysesthesias.  Add etodolac for leg pain.  He had questions about gabapentin as a doctor and police apparently told him it was illegal.  We discussed that it is a legal non-controlled drug in [place] (though controlled in some states).   Rtc 6 months or sooner if new or worsening issues  Chelsye Suhre A. Epimenio Foot, MD, [facility] 11/12/2023, 9:54 AM Certified in Neurology, Clinical Neurophysiology, Sleep Medicine and Neuroimaging  [facility] [address], [address] [place], [place] [address] [phone]

## 2023-11-13 ENCOUNTER — Telehealth: Payer: Self-pay | Admitting: Neurology

## 2023-11-13 LAB — CD20 B CELLS
% CD19-B Cells: 0 % — ABNORMAL LOW (ref 4.6–22.1)
% CD20-B Cells: 0 % — ABNORMAL LOW (ref 5.0–22.3)

## 2023-11-13 LAB — IGG, IGA, IGM
IgA/Immunoglobulin A, Serum: 173 mg/dL (ref 90–386)
IgG (Immunoglobin G), Serum: 1021 mg/dL (ref 603–1613)
IgM (Immunoglobulin M), Srm: 73 mg/dL (ref 20–172)

## 2023-11-13 NOTE — Telephone Encounter (Signed)
[facility] Harvey: [id number] exp. 11/13/23-01/12/24 sent to GI [phone]

## 2023-11-14 ENCOUNTER — Telehealth: Payer: Self-pay

## 2023-11-14 NOTE — Telephone Encounter (Signed)
*  [facility]  Pharmacy Patient Advocate Encounter   Received notification from CoverMyMeds that prior authorization for Etodolac 400MG  tablets  is required/requested.   Insurance verification completed.   The patient is insured through [facility] .   Per test claim: PA required; PA submitted to above mentioned insurance via CoverMyMeds Key/confirmation #/EOC [facility] Status is pending

## 2023-11-19 NOTE — Telephone Encounter (Signed)
Pharmacy Patient Advocate Encounter  Received notification from [facility] Medicaid that Prior Authorization for Etodolac 400MG  tablets has been DENIED.  Full denial letter will be uploaded to the media tab. See denial reason below.    PA #/Case ID/Reference #: PA Case ID #: [id number]  Per investigation of denial letter and chart notes PT has tried Naproxen and Ibuprofen-could not resubmit due to [facility] plan and was denied-forwarded to our Pharmacist to review.

## 2023-11-19 NOTE — Telephone Encounter (Signed)
Pharmacy Patient Advocate Encounter  Received notification from [facility] Medicaid that the appeal for Etodolac 400mg  Tablets has been APPROVED.   PA #/Case ID/Reference #: [id number]    Dellie Burns, PharmD Clinical Pharmacist [place]  Direct Dial: [phone]

## 2023-11-19 NOTE — Telephone Encounter (Signed)
Appeal has been submitted for Etodolac. Will advise when response is received or follow up in 1 week. Please be advised that most companies may take 30 days to make a decision. Appeal letter and all pertinent information has been faxed to [phone] on 11/19/2023 @12 :50 pm.  Dellie Burns, PharmD Clinical Pharmacist [place]  Direct Dial: [phone]

## 2023-11-20 ENCOUNTER — Encounter: Payer: Self-pay | Admitting: *Deleted

## 2023-11-20 NOTE — Telephone Encounter (Signed)
Sent patient my chart message informing him of the below.

## 2023-11-21 ENCOUNTER — Ambulatory Visit (HOSPITAL_COMMUNITY)
Admission: EM | Admit: 2023-11-21 | Discharge: 2023-11-21 | Disposition: A | Discharge status: Home / Self Care | Payer: Medicaid Other | Attending: Sports Medicine | Admitting: Sports Medicine

## 2023-11-21 ENCOUNTER — Encounter (HOSPITAL_COMMUNITY): Payer: Self-pay

## 2023-11-21 ENCOUNTER — Ambulatory Visit (INDEPENDENT_AMBULATORY_CARE_PROVIDER_SITE_OTHER): Payer: Medicaid Other

## 2023-11-21 DIAGNOSIS — M79642 Pain in left hand: Secondary | ICD-10-CM

## 2023-11-21 DIAGNOSIS — S6992XA Unspecified injury of left wrist, hand and finger(s), initial encounter: Secondary | ICD-10-CM | POA: Diagnosis not present

## 2023-11-21 NOTE — Discharge Instructions (Addendum)
You were seen for left hand pain. Your x-rays don't show any fractures. For pain I recommend you ice it for 15 minutes every 2-3 hours for the next 3 days. You can take tylenol 1000mg  every 8 hours and your Etodolac or other preferred anti-inflammatory medication (ie ibuprofen, naproxen, etc) - though don't take multiple medications from the NSAID family at the same time. You will need orthopedic follow-up in the 7-10 days for repeat x-rays to ensure there isn't an occult fracture that wasn't visible today. I have referred you to [facility] [phone], address: [address] [place], [place] [address]).

## 2023-11-21 NOTE — ED Provider Notes (Signed)
[facility]    CSN: [id number] Arrival date & time: 11/21/23  1208  History   Chief Complaint Chief Complaint  Patient presents with   Hand Problem   HPI Alvin Williams is a 37 y.o. male.   He presents for evaluation of left hand pain. He punched his car window yesterday and no has pain, swelling, and bruising of the ulnar aspect of his left hand. He hasn't taken anything from a pain perspective. He has some small abrasions on the dorsal aspect of digits 4-5 though no active bleeding. He is UTD on his tetanus immunization, last 07/2023. 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 He never had previous x-rays or surgeries of the left hand. No other complaints.    Past Medical History:  Diagnosis Date   MS (multiple sclerosis) [facility])    Patient Active Problem List   Diagnosis Date Noted   Encounter for monitoring immunomodulating therapy 11/12/2023   Gait disturbance 11/12/2023   Moderate episode of recurrent major depressive disorder (HCC) 08/07/2023   Routine medical exam 08/07/2023   Multiple sclerosis (HCC) 07/11/2022   History reviewed. No pertinent surgical history.   Home Medications    Prior to Admission medications   Medication Sig Start Date End Date Taking? Authorizing Provider  etodolac (LODINE) 400 MG tablet Take 1 tablet (400 mg total) by mouth 2 (two) times daily. 11/12/23  Yes Sater, Pearletha Furl, MD  gabapentin (NEURONTIN) 600 MG tablet Take 1 tablet (600 mg total) by mouth 3 (three) times daily. 09/11/23  Yes Sater, Pearletha Furl, MD  Ofatumumab ([name]) 20 MG/0.4ML SOAJ Inject 20 mg into the skin every 30 (thirty) days. 07/17/23  Yes Sater, Pearletha Furl, MD  baclofen (LIORESAL) 10 MG tablet Take 1 tablet (10 mg total) by mouth 3 (three) times daily. 09/11/23   Sater, Pearletha Furl, MD  Vitamin D, Ergocalciferol,  (DRISDOL) 1.25 MG (50000 UNIT) CAPS capsule Take 1 capsule (50,000 Units total) by mouth every 7 (seven) days. 07/10/23   Sater, Pearletha Furl, MD   Family History Family History  Problem Relation Age of Onset   Parkinson's disease Maternal Grandmother    Social History Social History   Tobacco Use   Smoking status: Former    Types: Cigarettes   Smokeless tobacco: Never  Vaping Use   Vaping status: Never Used  Substance Use Topics   Alcohol use: Yes    Alcohol/week: 1.0 standard drink of alcohol    Types: 1 Standard drinks or equivalent per week   Drug use: Yes    Frequency: 5.0 times per week    Types: Marijuana   Allergies   Patient has no known allergies.  Review of Systems Review of Systems  Musculoskeletal:  Positive for arthralgias and joint swelling.   Physical Exam Updated Vital Signs BP 125/84 (BP Location: Left Arm)   Pulse 75   Temp 98.9 F (37.2 C) (Oral)   Resp 16   SpO2 98%   Physical Exam Constitutional:      Appearance: Normal appearance.  HENT:     Head: Normocephalic and atraumatic.  Musculoskeletal:     Comments: Left hand with significant swelling over the dorsal and palmer aspect of 4-5 metacarpals. Surrounding erythema and bruising present. ROM limited by pain with flexion and extension, though can form a fist  and fully extend all digits. 5th PIP joint fixed in flexion without ability to actively or passively flex/extend. TTP over the entire 4-5th metacarpal, 4-5 MCPs, and 5th proximal and middle phalanx. Normal cap refill and sensation distally.  Skin:    Comments: Small superficial abrasions on dorsal aspect of digits 4 and 5 of left hand. No active bleeding or surrounding redness.  Neurological:     General: No focal deficit present.     Mental Status: He is alert and oriented to person, place, and time. Mental status is at baseline.  Psychiatric:        Mood and Affect: Mood normal.        Behavior: Behavior normal.    UC Treatments /  Results  Labs (all labs ordered are listed, but only abnormal results are displayed) Labs Reviewed - No data to display  EKG  Radiology No results found.  Procedures Procedures (including critical care time)  Medications Ordered in UC Medications - No data to display  Initial Impression / Assessment and Plan / UC Course  I have reviewed the triage vital signs and the nursing notes.  Pertinent labs & imaging results that were available during my care of the patient were reviewed by me and considered in my medical decision making (see chart for details).  Clinical Course as of 11/21/23 1325  Wed Nov 21, 2023  1256 DG Hand Complete Left [AC]    Clinical Course User Index [AC] Marisa Cyphers, MD   Vitals and triage reviewed, patient is hemodynamically stable.  On my review of his x-ray there is no fracture, dislocation or acute osseous abnormality - there is an old injury to his 5th PIP joint. Formal radiology read is still pending, I will call the patient with updates if they have different interpretation than my read. Based on his swelling I do have concern for a possible occult fracture of the 5th MC bone and would recommend he get repeat XR in 7-10 days. I recommended rest, icing, tylenol and use of his home Etodolac for pain. Referral placed to [facility] for repeat imaging in 7-10 days. Patient's questions were answered and they are in agreement with this plan.  Glean Salen, MD PGY-4, Sports Medicine Fellow [facility]   Final Clinical Impressions(s) / UC Diagnoses   Final diagnoses:  Left hand pain     Discharge Instructions      You were seen for left hand pain. Your x-rays don't show any fractures. For pain I recommend you ice it for 15 minutes every 2-3 hours for the next 3 days. You can take tylenol 1000mg  every 8 hours and your Etodolac or other preferred anti-inflammatory medication (ie ibuprofen, naproxen, etc) - though don't  take multiple medications from the NSAID family at the same time. You will need orthopedic follow-up in the 7-10 days for repeat x-rays to ensure there isn't an occult fracture that wasn't visible today. I have referred you to [facility] [phone], address: [address] [place], [place] [address]).      ED Prescriptions   None    PDMP not reviewed this encounter.   Marisa Cyphers, MD 11/21/23 1325

## 2023-11-21 NOTE — ED Triage Notes (Signed)
Pt presents to the office for left hand pain. Pt reports he punch the window on his car. Window did not break. Last tetanus is unknown.

## 2023-12-03 ENCOUNTER — Ambulatory Visit (HOSPITAL_COMMUNITY)
Admission: EM | Admit: 2023-12-03 | Discharge: 2023-12-03 | Disposition: A | Discharge status: Home / Self Care | Payer: Medicaid Other | Attending: Emergency Medicine | Admitting: Emergency Medicine

## 2023-12-03 ENCOUNTER — Other Ambulatory Visit: Payer: Self-pay

## 2023-12-03 ENCOUNTER — Ambulatory Visit (INDEPENDENT_AMBULATORY_CARE_PROVIDER_SITE_OTHER): Payer: Medicaid Other

## 2023-12-03 ENCOUNTER — Encounter (HOSPITAL_COMMUNITY): Payer: Self-pay | Admitting: Emergency Medicine

## 2023-12-03 DIAGNOSIS — M25532 Pain in left wrist: Secondary | ICD-10-CM | POA: Diagnosis not present

## 2023-12-03 DIAGNOSIS — M79642 Pain in left hand: Secondary | ICD-10-CM

## 2023-12-03 DIAGNOSIS — M7989 Other specified soft tissue disorders: Secondary | ICD-10-CM | POA: Diagnosis not present

## 2023-12-03 NOTE — ED Triage Notes (Addendum)
Patient has a history of left wrist injuries.  Patient was in department on 11/21/2023 and reported at that time that he had punched a car window.   Reports pain in lateral hand and left wrist States xray was done and he was instructed to return.  Reports pain is no better.

## 2023-12-03 NOTE — ED Notes (Signed)
Patient left saying he had to leave, had to take care of something.  Patient declined ace wrap

## 2023-12-03 NOTE — ED Provider Notes (Signed)
[facility]    CSN: [id number] Arrival date & time: 12/03/23  1350      History   Chief Complaint Chief Complaint  Patient presents with   Wrist Pain    HPI Alvin Williams is a 37 y.o. male.   Patient presents to clinic for continued left hand pain.  He has had swelling and pain along his left ulnar wrist area as well as his left fifth metacarpal.  He takes gabapentin daily for MS pain and he has been taking his etodolac twice daily.  He did not follow-up with [facility].  He presents to clinic for reassessment.  The history is provided by the patient and medical records.  Wrist Pain    Past Medical History:  Diagnosis Date   MS (multiple sclerosis) [facility])     Patient Active Problem List   Diagnosis Date Noted   Encounter for monitoring immunomodulating therapy 11/12/2023   Gait disturbance 11/12/2023   Moderate episode of recurrent major depressive disorder (HCC) 08/07/2023   Routine medical exam 08/07/2023   Multiple sclerosis (HCC) 07/11/2022    History reviewed. No pertinent surgical history.     Home Medications    Prior to Admission medications   Medication Sig Start Date End Date Taking? Authorizing Provider  baclofen (LIORESAL) 10 MG tablet Take 1 tablet (10 mg total) by mouth 3 (three) times daily. 09/11/23   Sater, Pearletha Furl, MD  etodolac (LODINE) 400 MG tablet Take 1 tablet (400 mg total) by mouth 2 (two) times daily. 11/12/23   Sater, Pearletha Furl, MD  gabapentin (NEURONTIN) 600 MG tablet Take 1 tablet (600 mg total) by mouth 3 (three) times daily. 09/11/23   Sater, Pearletha Furl, MD  Ofatumumab ([name]) 20 MG/0.4ML SOAJ Inject 20 mg into the skin every 30 (thirty) days. 07/17/23   Sater, Pearletha Furl, MD  Vitamin D, Ergocalciferol, (DRISDOL) 1.25 MG (50000 UNIT) CAPS capsule Take 1 capsule (50,000 Units total) by mouth every 7 (seven) days. 07/10/23   Sater, Pearletha Furl, MD    Family History Family History  Problem Relation Age of Onset    Parkinson's disease Maternal Grandmother     Social History Social History   Tobacco Use   Smoking status: Former    Types: Cigarettes   Smokeless tobacco: Never  Vaping Use   Vaping status: Never Used  Substance Use Topics   Alcohol use: Yes    Alcohol/week: 1.0 standard drink of alcohol    Types: 1 Standard drinks or equivalent per week   Drug use: Yes    Frequency: 5.0 times per week    Types: Marijuana     Allergies   Patient has no known allergies.   Review of Systems Review of Systems  Per HPI   Physical Exam Triage Vital Signs ED Triage Vitals  Encounter Vitals Group     BP 12/03/23 1501 104/63     Systolic BP Percentile --      Diastolic BP Percentile --      Pulse Rate 12/03/23 1501 83     Resp 12/03/23 1501 20     Temp 12/03/23 1501 98.3 F (36.8 C)     Temp Source 12/03/23 1501 Oral     SpO2 12/03/23 1501 96 %     Weight --      Height --      Head Circumference --      Peak Flow --      Pain Score 12/03/23 1500 9  Pain Loc --      Pain Education --      Exclude from Growth Chart --    No data found.  Updated Vital Signs BP 104/63 (BP Location: Right Arm)   Pulse 83   Temp 98.3 F (36.8 C) (Oral)   Resp 20   SpO2 96%   Visual Acuity Right Eye Distance:   Left Eye Distance:   Bilateral Distance:    Right Eye Near:   Left Eye Near:    Bilateral Near:     Physical Exam Vitals and nursing note reviewed.  Constitutional:      Appearance: Normal appearance.  HENT:     Head: Normocephalic and atraumatic.     Right Ear: External ear normal.     Left Ear: External ear normal.     Nose: Nose normal.     Mouth/Throat:     Mouth: Mucous membranes are moist.  Eyes:     Conjunctiva/sclera: Conjunctivae normal.  Cardiovascular:     Rate and Rhythm: Normal rate.     Pulses: Normal pulses.  Pulmonary:     Effort: Pulmonary effort is normal. No respiratory distress.  Musculoskeletal:        General: Swelling, tenderness and signs  of injury present. Normal range of motion.     Left hand: Swelling, tenderness and bony tenderness present. Normal range of motion. Normal capillary refill. Normal pulse.     Comments: Swelling and TTP along left 5th metacarpal. Diffuse wrist TTP, no obvious swelling or deformity. Chronic ROM changes to left pinky.   Skin:    General: Skin is warm and dry.     Capillary Refill: Capillary refill takes less than 2 seconds.  Neurological:     General: No focal deficit present.     Mental Status: He is alert and oriented to person, place, and time.  Psychiatric:        Mood and Affect: Mood normal.        Behavior: Behavior normal. Behavior is cooperative.      UC Treatments / Results  Labs (all labs ordered are listed, but only abnormal results are displayed) Labs Reviewed - No data to display  EKG   Radiology No results found.  Procedures Procedures (including critical care time)  Medications Ordered in UC Medications - No data to display  Initial Impression / Assessment and Plan / UC Course  I have reviewed the triage vital signs and the nursing notes.  Pertinent labs & imaging results that were available during my care of the patient were reviewed by me and considered in my medical decision making (see chart for details).  Patient presents to clinic for continued left hand pain after punching some glass.  He was seen in clinic the day after on 12/4 where he had negative imaging. Imaging repeated today due to continued pain and fifth metacarpal swelling, awaiting official radiology over-read. Offered ACE wrap for compression, patient reports he has to leave. Will call if radiology interpretation requires splinting if there is any obvious fracture.  Encouraged follow-up with orthopedic if symptoms persist.  Patient verbalized understanding, no questions at this time.     Final Clinical Impressions(s) / UC Diagnoses   Final diagnoses:  Left wrist pain  Left hand pain      Discharge Instructions      We have Ace wrap to your hand to help with compression and swelling.  You can continue to take your home pain medication.  The  official radiology interpretation for your images has not yet resulted, I will call you if the results require splinting in the case of any fractures.  Please follow-up with [facility] if your symptoms persist over the next few weeks.     ED Prescriptions   None    PDMP not reviewed this encounter.   Albert Devaul, [place], [place] 12/03/23 [phone]

## 2023-12-03 NOTE — Discharge Instructions (Addendum)
We have Ace wrap to your hand to help with compression and swelling.  You can continue to take your home pain medication.  The official radiology interpretation for your images has not yet resulted, I will call you if the results require splinting in the case of any fractures.  Please follow-up with [facility] if your symptoms persist over the next few weeks.

## 2023-12-03 NOTE — ED Notes (Signed)
Obtained vital signs in right arm.  Meanwhile patient manipulated phone with left hand-holding, texting-with left hand only

## 2023-12-05 DIAGNOSIS — M79642 Pain in left hand: Secondary | ICD-10-CM | POA: Diagnosis not present

## 2023-12-19 ENCOUNTER — Other Ambulatory Visit: Payer: Self-pay | Admitting: Neurology

## 2023-12-20 NOTE — Telephone Encounter (Signed)
 Last seen on 11/12/23 Follow up scheduled on 06/02/24

## 2023-12-21 IMAGING — DX DG CHEST 2V
2 series · 2 of 2 positions shown · non-contrast
Comparison: None.

CLINICAL DATA: Chest pain

EXAM:
CHEST - 2 VIEW

[chest pa]
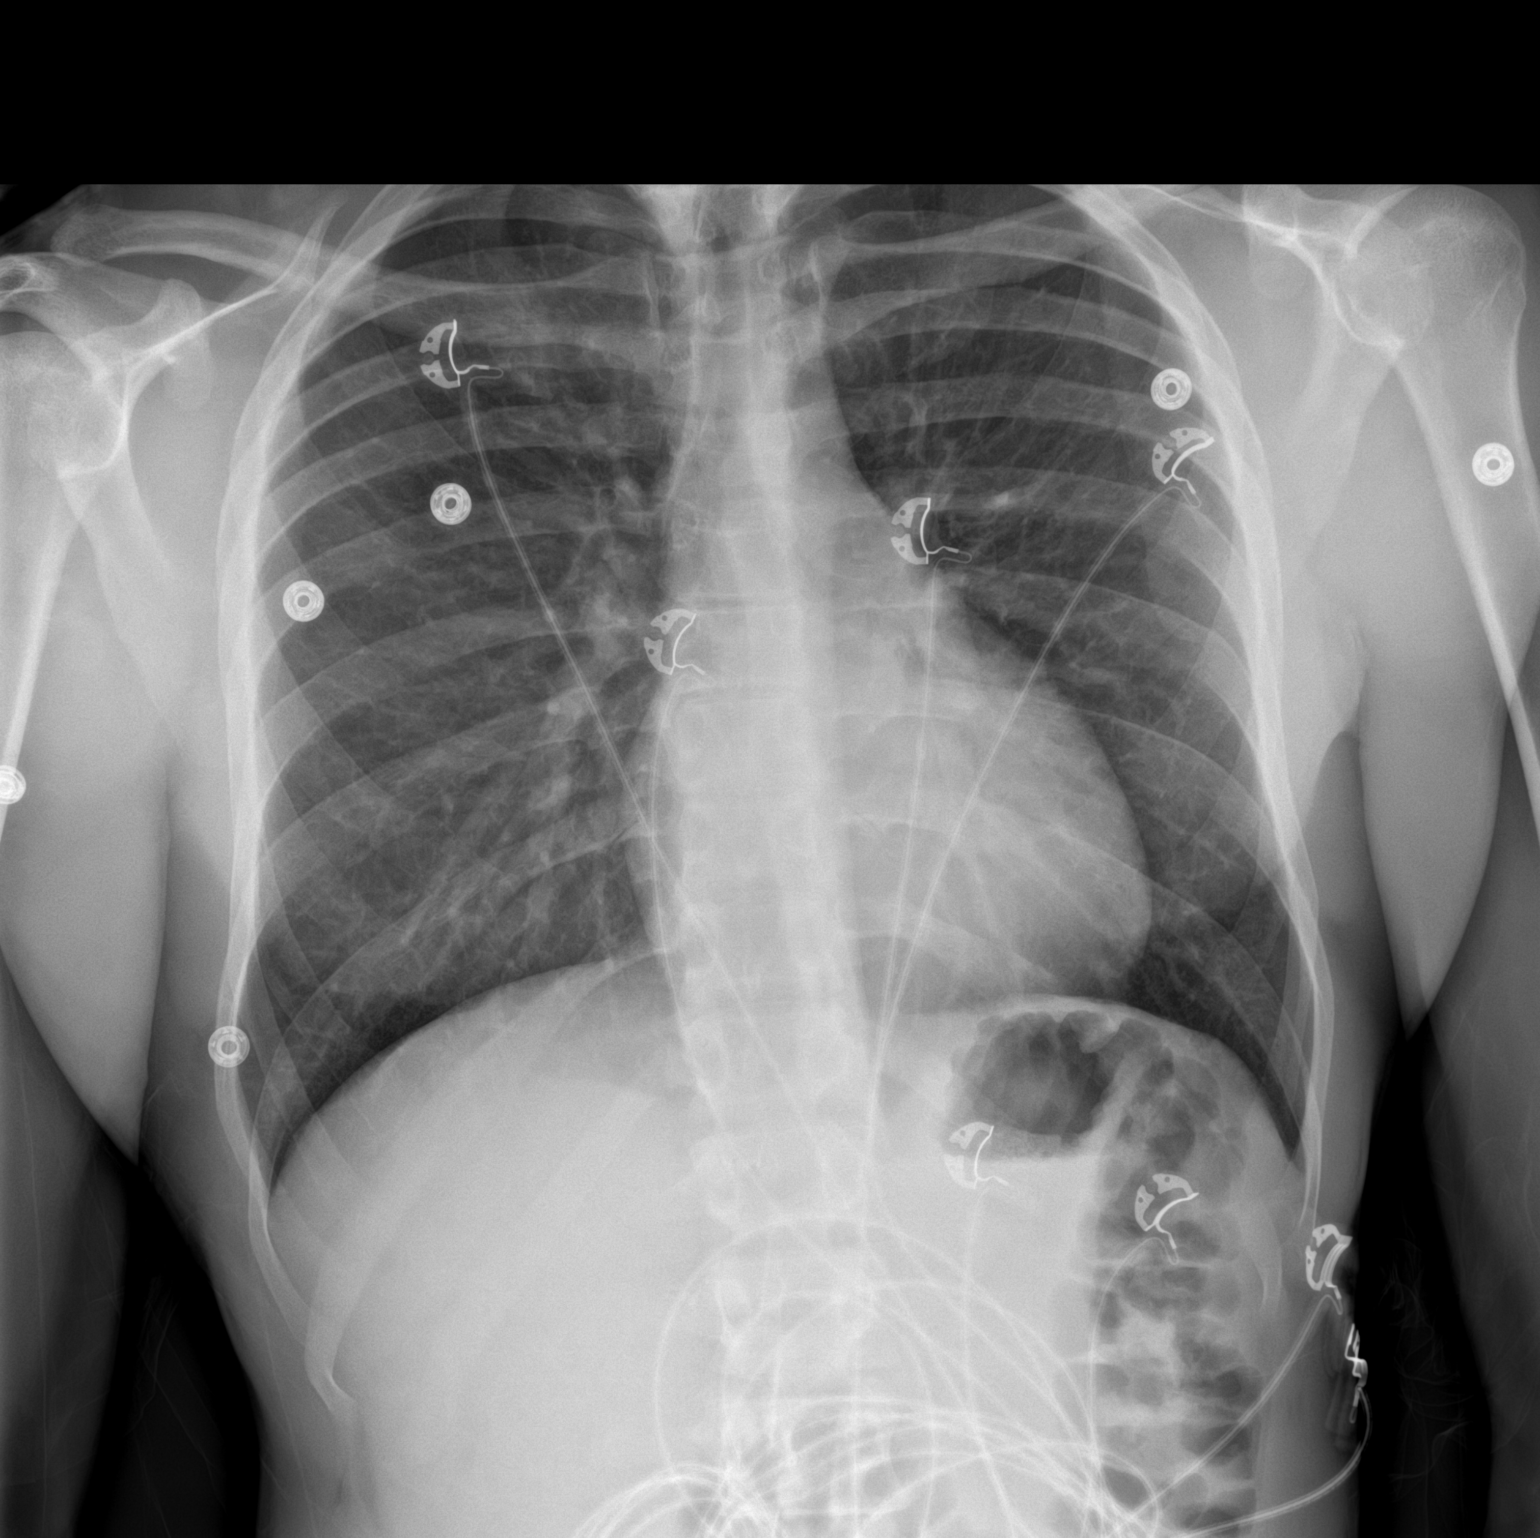

[chest lat]
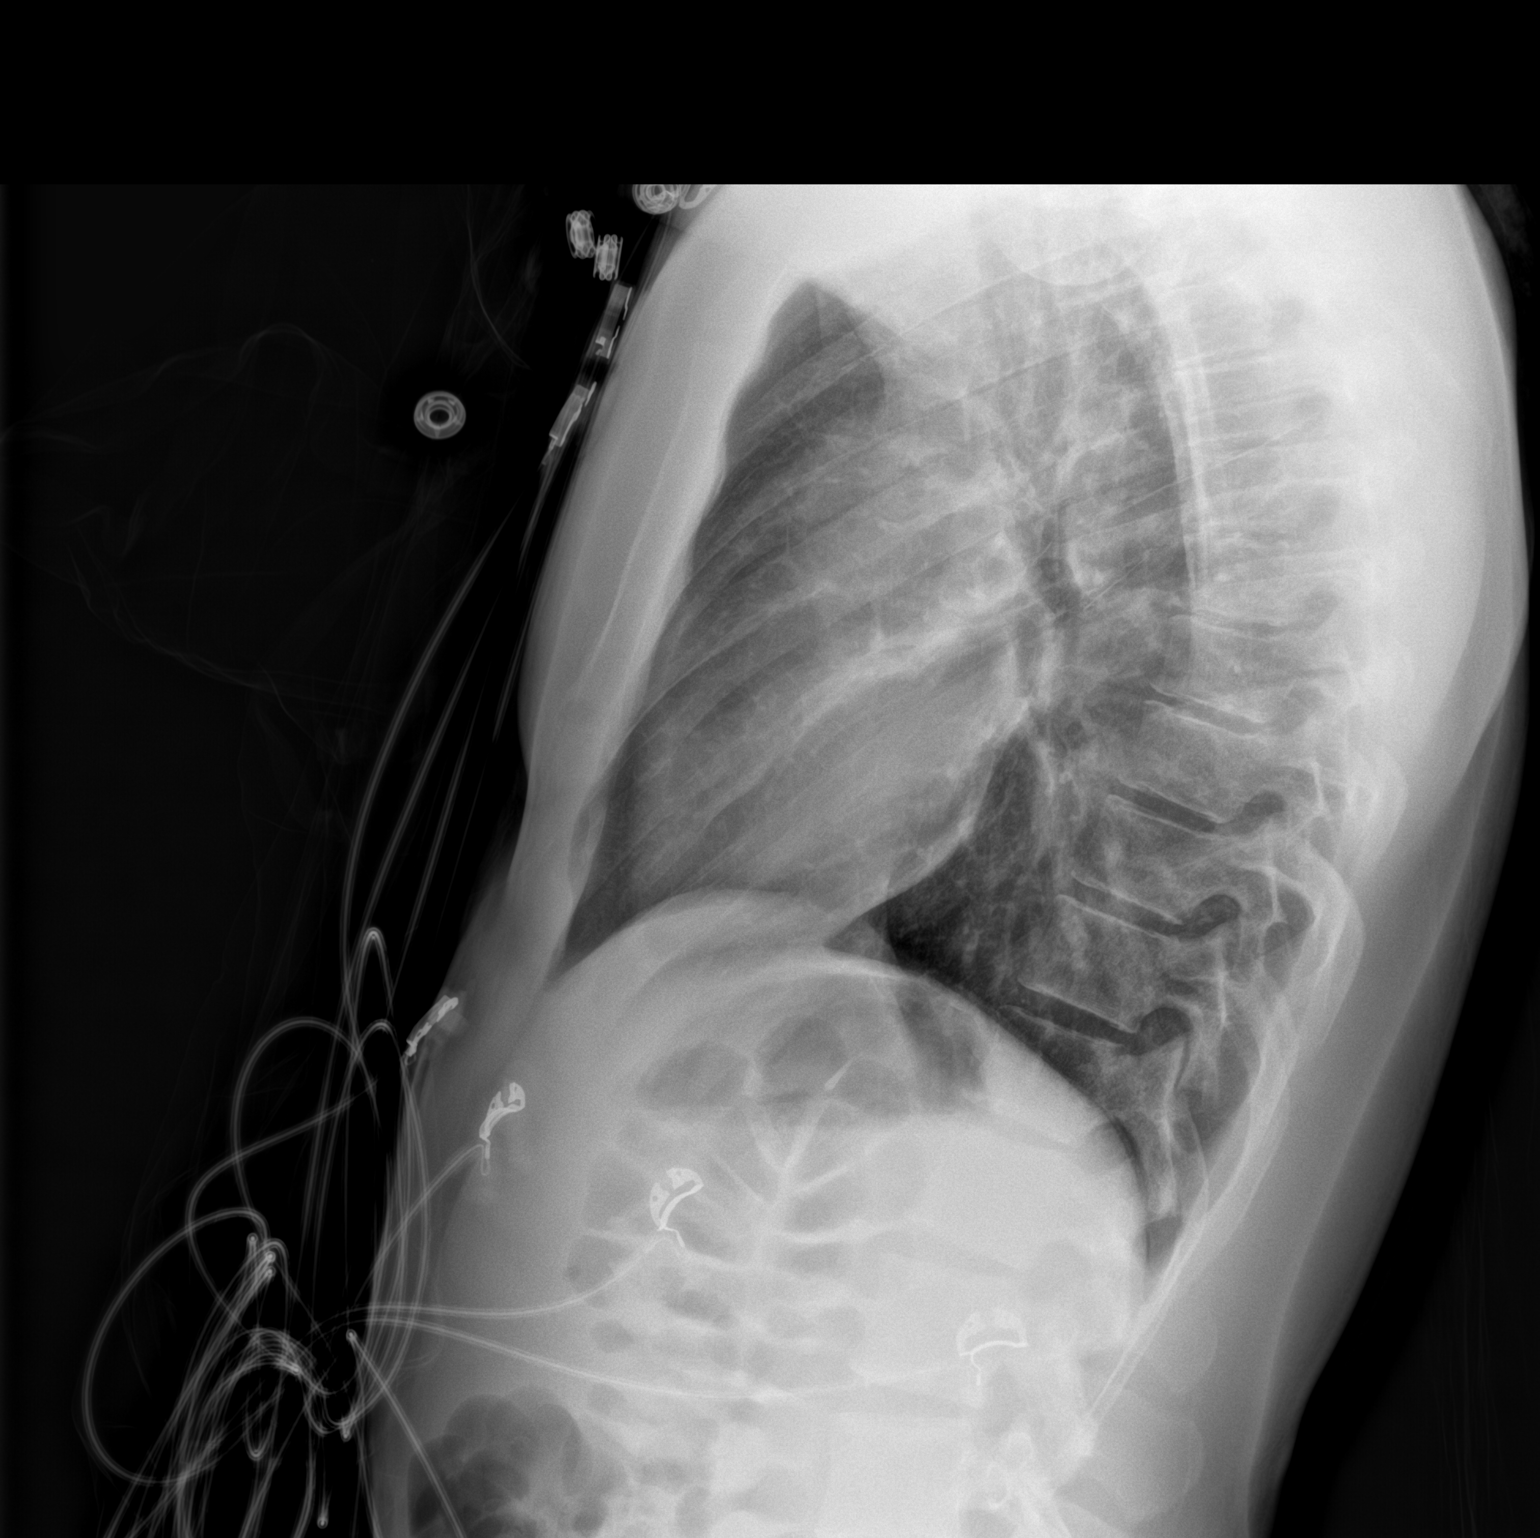

[2 of 2 positions shown; findings below may reference images not displayed]

FINDINGS: The heart size and mediastinal contours are within normal limits.
Both lungs are clear. No pleural effusion or pneumothorax. The
visualized skeletal structures are unremarkable.
IMPRESSION: No active cardiopulmonary disease.

## 2023-12-27 ENCOUNTER — Encounter: Payer: Self-pay | Admitting: Neurology

## 2023-12-28 ENCOUNTER — Other Ambulatory Visit: Payer: Self-pay

## 2024-01-01 ENCOUNTER — Encounter: Payer: Self-pay | Admitting: Neurology

## 2024-01-02 DIAGNOSIS — M79642 Pain in left hand: Secondary | ICD-10-CM | POA: Diagnosis not present

## 2024-01-02 DIAGNOSIS — M79632 Pain in left forearm: Secondary | ICD-10-CM | POA: Diagnosis not present

## 2024-01-03 ENCOUNTER — Telehealth: Payer: Self-pay | Admitting: Neurology

## 2024-01-03 ENCOUNTER — Other Ambulatory Visit: Payer: Self-pay | Admitting: Neurology

## 2024-01-03 NOTE — Telephone Encounter (Signed)
 Lmtrc 1st attempt

## 2024-01-03 NOTE — Telephone Encounter (Signed)
Pt is asking for a call from RN to discuss him being told by pharmacy that regarding the new medication he has recently been prescribed he should not take it with one of his MS medications, pt is unsure which medication that is, please call to discuss.

## 2024-01-04 ENCOUNTER — Ambulatory Visit
Admission: RE | Admit: 2024-01-04 | Discharge: 2024-01-04 | Disposition: A | Discharge status: Home / Self Care | Payer: Medicaid Other | Source: Ambulatory Visit | Attending: Neurology | Admitting: Neurology

## 2024-01-04 DIAGNOSIS — G35 Multiple sclerosis: Secondary | ICD-10-CM

## 2024-01-04 DIAGNOSIS — R269 Unspecified abnormalities of gait and mobility: Secondary | ICD-10-CM | POA: Diagnosis not present

## 2024-01-04 MED ORDER — GADOPICLENOL 0.5 MMOL/ML IV SOLN
7.0000 mL | Freq: Once | INTRAVENOUS | Status: AC | PRN
  Administered 2024-01-04: 7 mL via INTRAVENOUS

## 2024-01-05 ENCOUNTER — Encounter: Payer: Self-pay | Admitting: Neurology

## 2024-01-07 ENCOUNTER — Telehealth: Payer: Self-pay | Admitting: Neurology

## 2024-01-07 ENCOUNTER — Other Ambulatory Visit: Payer: Self-pay

## 2024-01-07 MED ORDER — GABAPENTIN 600 MG PO TABS: 600.0000 mg | ORAL_TABLET | Freq: Three times a day (TID) | ORAL | 0 refills | Status: DC

## 2024-01-07 NOTE — Telephone Encounter (Signed)
Pt last Seen 11/12/2023 Upcoming Appointment 06/02/2024 @ 3pm (Dr. Epimenio Foot)  Gabapentin 600mg  last filled 11/27/2023 30 day Supply Escript 01/07/2024 [facility] [address]

## 2024-01-07 NOTE — Telephone Encounter (Signed)
Pt is requesting a refill for gabapentin (NEURONTIN) 600 MG tablet,.  [facility]  Store ID: [id number]

## 2024-02-06 NOTE — Progress Notes (Deleted)
 Established Patient Office Visit  Subjective  Patient ID: Alvin Williams, male    DOB: 1986/08/01  Age: 38 y.o. MRN: [id number]  No chief complaint on file.   HPI Saadiq Poche Depression, Follow-up  He  was last seen for this {NUMBERS 1-12:18279} {days/wks/mos/yrs:310907} ago. Changes made at last visit include ***.   He reports {excellent/good/fair/poor:19665} compliance with treatment. He {is/is not:21021397} having side effects. ***  He reports {DESC; GOOD/FAIR/POOR:18685} tolerance of treatment. Current symptoms include: {Symptoms; depression:1002} He feels he is {improved/worse/unchanged:3041574} since last visit.     08/07/2023   10:14 AM  Depression screen PHQ 2/9  Decreased Interest 3  Down, Depressed, Hopeless 1  PHQ - 2 Score 4  Altered sleeping 3  Tired, decreased energy 3  Change in appetite 0  Feeling bad or failure about yourself  1  Trouble concentrating 2  Moving slowly or fidgety/restless 2  Suicidal thoughts 0  PHQ-9 Score 15  Difficult doing work/chores Somewhat difficult    -----------------------------------------------------------------------------------------  Patient Active Problem List   Diagnosis Date Noted   Encounter for monitoring immunomodulating therapy 11/12/2023   Gait disturbance 11/12/2023   Moderate episode of recurrent major depressive disorder (HCC) 08/07/2023   Routine medical exam 08/07/2023   Multiple sclerosis (HCC) 07/11/2022   Past Medical History:  Diagnosis Date   MS (multiple sclerosis) (HCC)    No past surgical history on file. Social History   Tobacco Use   Smoking status: Former    Types: Cigarettes   Smokeless tobacco: Never  Vaping Use   Vaping status: Never Used  Substance Use Topics   Alcohol use: Yes    Alcohol/week: 1.0 standard drink of alcohol    Types: 1 Standard drinks or equivalent per week   Drug use: Yes    Frequency: 5.0 times per week    Types: Marijuana   Social History    Socioeconomic History   Marital status: Single    Spouse name: Not on file   Number of children: 6   Years of education: Not on file   Highest education level: Not on file  Occupational History   Not on file  Tobacco Use   Smoking status: Former    Types: Cigarettes   Smokeless tobacco: Never  Vaping Use   Vaping status: Never Used  Substance and Sexual Activity   Alcohol use: Yes    Alcohol/week: 1.0 standard drink of alcohol    Types: 1 Standard drinks or equivalent per week   Drug use: Yes    Frequency: 5.0 times per week    Types: Marijuana   Sexual activity: Yes    Birth control/protection: None  Other Topics Concern   Not on file  Social History Narrative   Not on file   Social Drivers of Health   Financial Resource Strain: Low Risk  (08/07/2023)   Overall Financial Resource Strain (CARDIA)    Difficulty of Paying Living Expenses: Not hard at all  Food Insecurity: No Food Insecurity (08/07/2023)   Hunger Vital Sign    Worried About Running Out of Food in the Last Year: Never true    Ran Out of Food in the Last Year: Never true  Transportation Needs: No Transportation Needs (08/07/2023)   PRAPARE - Administrator, Civil Service (Medical): No    Lack of Transportation (Non-Medical): No  Physical Activity: Not on file  Stress: Not on file  Social Connections: Not on file  Intimate Partner Violence: Not At Risk (  08/07/2023)   Humiliation, Afraid, Rape, and Kick questionnaire    Fear of Current or Ex-Partner: No    Emotionally Abused: No    Physically Abused: No    Sexually Abused: No   Family Status  Relation Name Status   Mother  Alive   Father  Alive   MGM  Deceased   MGF  Deceased  No partnership data on file   Family History  Problem Relation Age of Onset   Parkinson's disease Maternal Grandmother    No Known Allergies    ROS Negative unless indicated in HPI   Objective:     There were no vitals taken for this visit. BP  Readings from Last 3 Encounters:  12/03/23 104/63  11/21/23 125/84  11/12/23 98/60   Wt Readings from Last 3 Encounters:  11/12/23 161 lb 8 oz (73.3 kg)  08/07/23 161 lb 9.6 oz (73.3 kg)  05/11/23 166 lb (75.3 kg)      Physical Exam   No results found for any visits on 02/07/24.  Last CBC Lab Results  Component Value Date   WBC 4.4 08/07/2023   HGB 14.5 08/07/2023   HCT 43.8 08/07/2023   MCV 90 08/07/2023   MCH 29.8 08/07/2023   RDW 12.9 08/07/2023   PLT 231 08/07/2023   Last metabolic panel Lab Results  Component Value Date   GLUCOSE 79 08/07/2023   NA 138 08/07/2023   K 4.7 08/07/2023   CL 100 08/07/2023   CO2 25 08/07/2023   BUN 13 08/07/2023   CREATININE 1.29 (H) 08/07/2023   EGFR 75 08/07/2023   CALCIUM 10.1 08/07/2023   PROT 6.9 08/07/2023   ALBUMIN 4.7 08/07/2023   LABGLOB 2.2 08/07/2023   AGRATIO 2.1 10/17/2022   BILITOT 1.0 08/07/2023   ALKPHOS 61 08/07/2023   AST 21 08/07/2023   ALT 12 08/07/2023   ANIONGAP 8 02/15/2022   Last lipids Lab Results  Component Value Date   CHOL 224 (H) 08/07/2023   HDL 74 08/07/2023   LDLCALC 138 (H) 08/07/2023   TRIG 69 08/07/2023   CHOLHDL 3.0 08/07/2023   Last hemoglobin A1c No results found for: "HGBA1C" Last thyroid functions Lab Results  Component Value Date   TSH 0.648 08/07/2023   T4TOTAL 5.9 08/07/2023        Assessment & Plan:  There are no diagnoses linked to this encounter. Continue healthy lifestyle choices, including diet (rich in fruits, vegetables, and lean proteins, and low in salt and simple carbohydrates) and exercise (at least 30 minutes of moderate physical activity daily).     The above assessment and management plan was discussed with the patient. The patient verbalized understanding of and has agreed to the management plan. Patient is aware to call the clinic if they develop any new symptoms or if symptoms persist or worsen. Patient is aware when to return to the clinic for a  follow-up visit. Patient educated on when it is appropriate to go to the emergency department.  No follow-ups on file.    Arrie Aran Santa Lighter, [facility] [address] [place], [place] [address] [phone]    Note: This document was prepared by [employer] voice dictation technology and any errors that results from this process are unintentional.

## 2024-02-07 ENCOUNTER — Ambulatory Visit: Payer: Medicaid Other | Admitting: Nurse Practitioner

## 2024-02-07 ENCOUNTER — Telehealth (INDEPENDENT_AMBULATORY_CARE_PROVIDER_SITE_OTHER): Payer: Medicaid Other | Admitting: Nurse Practitioner

## 2024-02-07 ENCOUNTER — Encounter: Payer: Self-pay | Admitting: Nurse Practitioner

## 2024-02-07 DIAGNOSIS — F331 Major depressive disorder, recurrent, moderate: Secondary | ICD-10-CM | POA: Diagnosis not present

## 2024-02-07 DIAGNOSIS — G35 Multiple sclerosis: Secondary | ICD-10-CM | POA: Diagnosis not present

## 2024-02-07 DIAGNOSIS — E559 Vitamin D deficiency, unspecified: Secondary | ICD-10-CM | POA: Insufficient documentation

## 2024-02-07 DIAGNOSIS — M25532 Pain in left wrist: Secondary | ICD-10-CM | POA: Insufficient documentation

## 2024-02-07 NOTE — Progress Notes (Signed)
 Virtual Visit via video Note Due to COVID-19 pandemic this visit was conducted virtually. This visit type was conducted due to national recommendations for restrictions regarding the COVID-19 Pandemic (e.g. social distancing, sheltering in place) in an effort to limit this patient's exposure and mitigate transmission in our community. All issues noted in this document were discussed and addressed.  A physical exam was not performed with this format.   I connected with Alvin Williams on 02/07/2024 at 1355 by Name and DOB and verified that I am speaking with the correct person using two identifiers. Alvin Williams is currently located at home  during visit. The provider, Martina Sinner, DNP is located in their office at time of visit.  I discussed the limitations, risks, security and privacy concerns of performing an evaluation and management service by virtual visit and the availability of in person appointments. I also discussed with the patient that there may be a patient responsible charge related to this service. The patient expressed understanding and agreed to proceed.  Subjective:  Patient ID: Alvin Williams, male    DOB: January 08, 1986, 38 y.o.   MRN: [id number]  Chief Complaint:  3 months  (Chronic disease management)   HPI: Alvin Williams is a 38 y.o. male presenting via telehealth on 02/07/2024 for 3 months  (Chronic disease management)  Left hand/wrist pain: Was seen at an urgent care 11/21/2023  for left hand pain which he sustained by punching his car window, had x-ray done "No acute osseous abnormality". He was supposed to follow with Dr [facility] and never did. He was back at the same urgent care 12/03/2023 treated for left wrist pain, x-ray "Unchanged appearance of chronic healed fifth metacarpal fracture.  No acute fracture of the hand or wrist. Unchanged flexion of the fifth digit at the proximal  interphalangeal joint." Reports that he lost his job due to his injuring.  Denies any pain  at injury site.  Vit D: 38 year old male with a history of multiple sclerosis (MS) presents today for follow-up. He is currently taking vitamin D supplements due to a deficiency. The patient is stable and reports no new symptoms related to her MS. A plan is in place to check her vitamin D levels to assess current status  MS: 38 year old male with a history of multiple sclerosis (MS) presents today for follow-up. He is currently being managed with Neurontin 600 mg TID, Lodine 400 mg BID, [name] 20 mg injection every 30 days, and Baclofen 10 mg TID. The patient denies any side effects from his medications. He had an MRI on January 04, 2024, which showed several new lesions in the cerebral hemispheres compared to his 2022 MRI. He reports increased pain with ambulation and plans to discuss this symptom with his rheumatologist at his next appointment.  Depression, Follow-up He has a dx of depression and still refusing medication and counseling. Endorse 2-3 g of THC weekly which help hs symptoms. Denies SI/HI     08/07/2023   10:14 AM  Depression screen PHQ 2/9  Decreased Interest 3  Down, Depressed, Hopeless 1  PHQ - 2 Score 4  Altered sleeping 3  Tired, decreased energy 3  Change in appetite 0  Feeling bad or failure about yourself  1  Trouble concentrating 2  Moving slowly or fidgety/restless 2  Suicidal thoughts 0  PHQ-9 Score 15  Difficult doing work/chores Somewhat difficult     Relevant past medical, surgical, family, and social history reviewed and updated as indicated.  Allergies and medications reviewed and updated.   Past Medical History:  Diagnosis Date   MS (multiple sclerosis) (HCC)     History reviewed. No pertinent surgical history.  Social History   Socioeconomic History   Marital status: Single    Spouse name: Not on file   Number of children: 6   Years of education: Not on file   Highest education level: Not on file  Occupational History    Not on file  Tobacco Use   Smoking status: Former    Types: Cigarettes   Smokeless tobacco: Never  Vaping Use   Vaping status: Never Used  Substance and Sexual Activity   Alcohol use: Yes    Alcohol/week: 1.0 standard drink of alcohol    Types: 1 Standard drinks or equivalent per week   Drug use: Yes    Frequency: 5.0 times per week    Types: Marijuana   Sexual activity: Yes    Birth control/protection: None  Other Topics Concern   Not on file  Social History Narrative   Not on file   Social Drivers of Health   Financial Resource Strain: Low Risk  (08/07/2023)   Overall Financial Resource Strain (CARDIA)    Difficulty of Paying Living Expenses: Not hard at all  Food Insecurity: No Food Insecurity (08/07/2023)   Hunger Vital Sign    Worried About Running Out of Food in the Last Year: Never true    Ran Out of Food in the Last Year: Never true  Transportation Needs: No Transportation Needs (08/07/2023)   PRAPARE - Administrator, Civil Service (Medical): No    Lack of Transportation (Non-Medical): No  Physical Activity: Inactive (02/07/2024)   Exercise Vital Sign    Days of Exercise per Week: 0 days    Minutes of Exercise per Session: 0 min  Stress: Not on file  Social Connections: Not on file  Intimate Partner Violence: Not At Risk (08/07/2023)   Humiliation, Afraid, Rape, and Kick questionnaire    Fear of Current or Ex-Partner: No    Emotionally Abused: No    Physically Abused: No    Sexually Abused: No    Outpatient Encounter Medications as of 02/07/2024  Medication Sig   baclofen (LIORESAL) 10 MG tablet TAKE 1 TABLET BY MOUTH THREE TIMES A DAY   etodolac (LODINE) 400 MG tablet Take 1 tablet (400 mg total) by mouth 2 (two) times daily.   gabapentin (NEURONTIN) 600 MG tablet Take 1 tablet (600 mg total) by mouth 3 (three) times daily.   Ofatumumab ([name]) 20 MG/0.4ML SOAJ Inject 20 mg into the skin every 30 (thirty) days.   Vitamin D, Ergocalciferol,  (DRISDOL) 1.25 MG (50000 UNIT) CAPS capsule Take 1 capsule (50,000 Units total) by mouth every 7 (seven) days.   No facility-administered encounter medications on file as of 02/07/2024.    No Known Allergies  Review of Systems  Constitutional:  Negative for chills, fatigue and fever.  HENT:  Negative for congestion, rhinorrhea and sore throat.   Eyes:  Negative for photophobia and pain.  Respiratory:  Negative for cough and wheezing.   Cardiovascular:  Positive for leg swelling. Negative for chest pain.       D/t MS  Gastrointestinal:  Negative for diarrhea, nausea and vomiting.  Endocrine: Negative for polydipsia, polyphagia and polyuria.  Musculoskeletal:  Positive for gait problem.       MS  Skin:  Negative for color change and rash.  Neurological:  Negative  for dizziness and headaches.  Psychiatric/Behavioral:  Negative for sleep disturbance and suicidal ideas. The patient is not nervous/anxious.          Observations/Objective: No vital signs or physical exam, this was a virtual health encounter.  Pt alert and oriented, answers all questions appropriately, and able to speak in full sentences.    Assessment and Plan: Author was seen today for 3 months .  Diagnoses and all orders for this visit:  Vitamin D deficiency -     VITAMIN D 25 Hydroxy (Vit-D Deficiency, Fractures); Future  Multiple sclerosis (HCC)  Moderate episode of recurrent major depressive disorder (HCC)  Left wrist pain   Alvin Williams is a 38 year old African-American seen today for 2 months follow-up appointment disease management, no acute distress Left wrist pain: Continue gabapentin, loratadine MS: Continue follow-up with rheumatologist for the [name] injection Depression: Still refusing counseling and medication Lab: Vitamin D result pending  Follow Up Instructions: Return in about 6 months (around 08/06/2024) for physical with labs.    I discussed the assessment and treatment plan with the  patient. The patient was provided an opportunity to ask questions and all were answered. The patient agreed with the plan and demonstrated an understanding of the instructions.   The patient was advised to call back or seek an in-person evaluation if the symptoms worsen or if the condition fails to improve as anticipated.  The above assessment and management plan was discussed with the patient. The patient verbalized understanding of and has agreed to the management plan. Patient is aware to call the clinic if they develop any new symptoms or if symptoms persist or worsen. Patient is aware when to return to the clinic for a follow-up visit. Patient educated on when it is appropriate to go to the emergency department.    I provided 20 minutes of time during this video encounter.   Arrie Aran Santa Lighter, DNP [facility] [address] [place], [place] [address] [phone] 02/07/2024

## 2024-03-18 ENCOUNTER — Other Ambulatory Visit: Payer: Self-pay | Admitting: Neurology

## 2024-03-18 NOTE — Telephone Encounter (Signed)
 Last seen on 11/12/23 Follow up scheduled on 06/02/24

## 2024-03-20 DIAGNOSIS — M65352 Trigger finger, left little finger: Secondary | ICD-10-CM | POA: Insufficient documentation

## 2024-03-25 ENCOUNTER — Ambulatory Visit: Payer: Self-pay

## 2024-03-25 DIAGNOSIS — F339 Major depressive disorder, recurrent, unspecified: Secondary | ICD-10-CM

## 2024-03-25 NOTE — Telephone Encounter (Signed)
 Chief Complaint: depression, requesting mental health referral Symptoms: mood swings, depression, making poor decisions, difficulty sleeping, angry, intermittent chest pains Frequency: x couple of months Pertinent Negatives: Patient denies suicidal thoughts or plans Disposition: [] ED /[] [facility] (no appt availability in office) / [x] Appointment(In office/virtual)/ []  [facility]/ [] Home [facility]/ [x] Refused Recommended Disposition /[] [facility]/ []  Follow-up with PCP Additional Notes: Patient provided with 24/7 [facility] Hotline phone # and the address for [facility] in [place]. Patient requesting mental health referral that he states his provider mentioned at his last visit, "a few months ago". Patient states he was drinking alcohol this past weekend and made some poor decisions as well as he is going thru a break up with his girlfriend. Patient denies any plans or thoughts to harm himself. Patient verbalizes understanding to call back for new or worsening symptoms.  Copied from CRM [id number]. Topic: Referral - Request for Referral >> Mar 25, 2024  9:21 AM Franchot Heidelberg wrote: Did the patient discuss referral with their provider in the last year? Yes (If No - schedule appointment) (If Yes - send message)  Appointment offered? Yes  Type of order/referral and detailed reason for visit: Mental Health   Says he discussed this with his provider and initially declined her offer, he has now reconsidered and wants to proceed with mental health assistance. Says he feels the most symptomatic when he drinks alcohol   Preference of office, provider, location: Highest recommended locally   If referral order, have you been seen by this specialty before? No (If Yes, this issue or another issue? When? Where?  Can we respond through MyChart? Yes Reason for Disposition  Unhealthy alcohol use, known or suspected  Answer Assessment - Initial Assessment  Questions 1. CONCERN: "What happened that made you call today?"     He states not specific situation that happened today. He states the other day he was making poor decision after he had been drinking.  2. DEPRESSION SYMPTOM SCREENING: "How are you feeling overall?" (e.g., decreased energy, increased sleeping or difficulty sleeping, difficulty concentrating, feelings of sadness, guilt, hopelessness, or worthlessness)     Difficulty sleeping (going to sleep late and waking up early), mood swings, weight loss (30 pounds lost over the last 2-3 months), depression, angry.  3. RISK OF HARM - SUICIDAL IDEATION:  "Do you ever have thoughts of hurting or killing yourself?"  (e.g., yes, no, no but preoccupation with thoughts about death)   - INTENT:  "Do you have thoughts of hurting or killing yourself right NOW?" (e.g., yes, no, N/A)   - PLAN: "Do you have a specific plan for how you would do this?" (e.g., gun, knife, overdose, no plan, N/A)     Denies.  4. RISK OF HARM - HOMICIDAL IDEATION:  "Do you ever have thoughts of hurting or killing someone else?"  (e.g., yes, no, no but preoccupation with thoughts about death)   - INTENT:  "Do you have thoughts of hurting or killing someone right NOW?" (e.g., yes, no, N/A)   - PLAN: "Do you have a specific plan for how you would do this?" (e.g., gun, knife, no plan, N/A)      Denies.  5. FUNCTIONAL IMPAIRMENT: "How have things been going for you overall? Have you had more difficulty than usual doing your normal daily activities?"  (e.g., better, same, worse; self-Williams, school, work, interactions)     He states things got better because he got a new  job but then things at home worsened, he is going through a break up right now.  6. SUPPORT: "Who is with you now?" "Who do you live with?" "Do you have family or friends who you can talk to?"      He states his mother and his baby's mother. He states his mother was the one who suggested that he call in.  7.  THERAPIST: "Do you have a counselor or therapist? Name?"     Denies.  8. STRESSORS: "Has there been any new stress or recent changes in your life?"     He states his relationship has fallen apart and caused stress.  9. ALCOHOL USE OR SUBSTANCE USE (DRUG USE): "Do you drink alcohol or use any illegal drugs?"     Yes, he states he does not drink often. Only on the weekends sometimes but he drank too much this past weekend he states.  10. OTHER: "Do you have any other physical symptoms right now?" (e.g., fever)       He states sometimes he gets chest pains for a few seconds, at most a minute or two. He states they come on sometimes while he is at rest. Ongoing for about 3-4 months.  11. PREGNANCY: "Is there any chance you are pregnant?" "When was your last menstrual period?"       N/A.  Protocols used: Depression-A-AH

## 2024-03-26 NOTE — Addendum Note (Signed)
 Addended by: Daisy Blossom on: 03/26/2024 12:13 PM   Modules accepted: Orders

## 2024-03-26 NOTE — Telephone Encounter (Signed)
 Referral placed. Mychart message sent informing pt

## 2024-04-26 ENCOUNTER — Other Ambulatory Visit: Payer: Self-pay | Admitting: Neurology

## 2024-04-26 MED ORDER — BACLOFEN 10 MG PO TABS: 10.0000 mg | ORAL_TABLET | Freq: Three times a day (TID) | ORAL | 0 refills | Status: DC

## 2024-04-26 MED ORDER — GABAPENTIN 600 MG PO TABS: 600.0000 mg | ORAL_TABLET | Freq: Three times a day (TID) | ORAL | 0 refills | Status: DC

## 2024-04-26 MED ORDER — ETODOLAC 400 MG PO TABS: 400.0000 mg | ORAL_TABLET | Freq: Two times a day (BID) | ORAL | 0 refills | Status: DC

## 2024-04-26 NOTE — Progress Notes (Unsigned)
 Patient called and asking for a refill on all his meds (Baclofen , Etodolac  and Gabapentin ) 30 days supply given to patient.   Dr. Damari Hiltz

## 2024-04-28 ENCOUNTER — Telehealth: Payer: Self-pay | Admitting: Neurology

## 2024-04-28 NOTE — Telephone Encounter (Signed)
 Pt last seen 11/12/23 and next f/u 06/02/24.   Gabapentin  refill/dispensed 04/26/24 #90 Baclofen  refill/dispensed 04/26/24 #90 Both [facility] to:  [id number] [place] [place] - [address] Phone: [phone]  Fax: [phone]      Meloxicam not on medication list.   I called pt. He states he did not pick up refills on 04/26/24. He will call them to have refills transferred to [facility] location. He will ask for etodolac  to be transferred also. He has not had [name]  in 1.5 months. He will call [facility] to set up shipment. Per MD, ok to go back on maintenance dose, no starter dose needed. Recommended he ask them to expedite shipment for him. He verbalized understanding and appreciation.  Meloxicam not rx'd by this office. However, he is already on etodolac  and should not be on both.

## 2024-04-28 NOTE — Telephone Encounter (Signed)
 Pt is requesting a refill for gabapentin  (NEURONTIN ) 600 MG tablet,  baclofen  (LIORESAL ) 10 MG tablet & Meloxicam .  [facility] ID: [id number]

## 2024-04-30 ENCOUNTER — Telehealth: Payer: Self-pay

## 2024-04-30 ENCOUNTER — Other Ambulatory Visit (HOSPITAL_COMMUNITY): Payer: Self-pay

## 2024-04-30 NOTE — Telephone Encounter (Signed)
 Pharmacy Patient Advocate Encounter   Received notification from CoverMyMeds that prior authorization for [name]  20MG /0.4ML auto-injectors is required/requested.   Insurance verification completed.   The patient is insured through [facility] [place] .   Per test claim: The current 30 day co-pay is, $4.00.  No PA needed at this time. This test claim was processed through [facility]- copay amounts may vary at other pharmacies due to pharmacy/plan contracts, or as the patient moves through the different stages of their insurance plan.

## 2024-05-05 NOTE — Telephone Encounter (Addendum)
 See notes below, no PA needed. I spoke w/ pt on 04/28/24 advising him to call [facility] to set up expedited shipment of [name] .   I called [facility] at [phone] to get update. Spoke w/ Austine Lefort. Confirmed rx on file. Medication on hold. They had incorrect insurance on file. I provided him [facility] insurance info. He will process and then reach out to pt to schedule delivery once everything updated on their end. Asked them to expedite since pt out of medication. Total time of call w/ [facility]: 18 min  I called pt to update him. Mailbox full and unable to leave message.

## 2024-05-05 NOTE — Telephone Encounter (Signed)
 Pt called stating he is behind one [name]  Inj.  He states that he spoke to the insurance and they informed me that the PA has to be worked. Please advise.

## 2024-05-06 NOTE — Telephone Encounter (Signed)
 Called and spoke w/ pt. Relayed below info. He will call [facility] today to f/u and get update on scheduling delivery of [name] .

## 2024-05-07 NOTE — Telephone Encounter (Signed)
 Pt has called to report that the referral was put in for his treatment, he was told to call so the order could be accepted faster.

## 2024-05-07 NOTE — Telephone Encounter (Signed)
 Called [facility] to check on status of [name] . Spoke w/ Alvin Williams. Case still open, verifying benefits currently. She can see that they worked on things today but cannot see details. Pt should be called this week to schedule delivery.  Already marked as expedited request.

## 2024-05-07 NOTE — Telephone Encounter (Signed)
 I called pt and provided him update. If he does not hear from [facility] by Friday to schedule delivery, he will call them. He will call us  back if he has any trouble setting up shipment.

## 2024-05-09 NOTE — Telephone Encounter (Addendum)
 1Pt reports that [facility] has advised him to f/u here re: the Rx for his [name]  .  Pt is asking for a call for assistance.

## 2024-05-13 NOTE — Telephone Encounter (Signed)
 Called [facility] at [phone]. Spoke w/ Kayren Pass. Got paid claim and got reversed. Stopped prescription from processing.  I relayed again that no PA required per our PA team, copay should be 4.00. She is unsure why process did not get started again. She is escalating case and I made them aware again that patient out of medication. This needs to be resolved ASAP.   She submitted urgent request. Suggest calling back in 24 hours to verify status. I called pt and relayed update. He will call me back tomorrow if he continues having issues setting up shipment of medication.

## 2024-05-13 NOTE — Telephone Encounter (Signed)
 Receive below fax today. Faxed back, received fax confirmation.

## 2024-05-25 ENCOUNTER — Other Ambulatory Visit: Payer: Self-pay | Admitting: Neurology

## 2024-05-29 NOTE — Patient Instructions (Incomplete)

## 2024-05-29 NOTE — Progress Notes (Deleted)
 No chief complaint on file.   HISTORY OF PRESENT ILLNESS:  05/29/24 ALL:  Alvin Williams is a 38 y.o. male here today for follow up for MS. He was last seen by Dr Godwin Lat 10/2023. MRI showed several new lesions in cerebral hemispheres but did not look to be acute. [name]  continued.   He reports doing    He has bilateral leg pain, L>R.  Baclofen  10mg  TID Gabapentin  600mg  TID  Etodolac   Sleep Mood   Vitamin D    HISTORY (copied from Dr Thom Fleeting previous note)  Alvin Williams is a 38 y.o. man who was diagnosed with MS in 2020.     Update 11/12/2023: He switched to Ksimpta (was on Tysabri ) and tolerates it well.       He is reporting bilateral leg pain, L>R, and worse in the back of the upper legs.   His legs are a little stiff.   He is on baclofen  10 mg po tid and gabapentin  600 mg po tid.  He is not sure if gabapentin  400 mg po tid has helped.  He tolerates it well.       Currently, he notes his gait is off balanced.  He had 1 fall.   He uses the bannister on stairs.  He has pain in both legs., left > right.  He notes some leg numbness.   He notes spasticity in leg muscles  He also notes milder arm pain and numbness   He will sometimes drop items.     He has urinary frequency but no incontinence.    He denies any issues with his vision currently.[facility]  He had nystagmus at the time of diagnosis.     He notes reduced memory and focus.  He has some depression and anxiety.   He feels he sleeps well most nights.  No snoring.   He notes fatigue.   He is otherwise in good health.       MS HISTORY In 2019 he had an episode of numbness and pain in the legs x 2 weeks.  One month later, he had symptoms in the left hand x 1 month.   A couple months later, while at work he noted vision was blurry when he would turn his head for a few seconds at a time and his vision was jerking.     He was referred to Dr, Joleen Navy and then had MRI's performed that were consistent with MS.  [facility]   He was placed on  Tysabri  and continues on it.    He has done well with no exacerbation.    He has been doing infusions at [facility].    Last infusion was 2 weeks ago.   By report, he is JCV positive and the Tysabri  was stretched to every 6 weeks.   He was on Tysabri  with a low positive titer (0.88 on 12/13/2022) every 6 weeks.     Last infusion was late January 2024.  We switched to [name]  in May 2024.   Imaging:  MRI of the brain 10/20/2021 showed multiple T2/FLAIR hyperintense foci in the cerebral hemispheres, predominantly in the periventricular white matter radially oriented to the ventricles.  Foci also noted in the right cerebellar hemisphere, and right posterior pons.     MRI of the cervical spine 10/20/2021 showed T2 hyperintense foci within the spinal cord adjacent to C1-C2, adjacent to C3 centrally and towards the left, adjacent to C3 posterolaterally to the right (small), adjacent to C4-C5 posterolaterally  to the right   MRI of the brain 05/16/2019 showed large enhancing lesions in the periventricular white matter of both posterior frontal/parietal lobes and another small T2 hyperintense foci in the periventricular white matter.     MRI of the cervical spine 05/16/2019 shows foci adjacent to C1-C2 and adjacent to C2-C3 (enhancing) and C5.   REVIEW OF SYSTEMS: Out of a complete 14 system review of symptoms, the patient complains only of the following symptoms, and all other reviewed systems are negative.   ALLERGIES: No Known Allergies   HOME MEDICATIONS: Outpatient Medications Prior to Visit  Medication Sig Dispense Refill   baclofen  (LIORESAL ) 10 MG tablet Take 1 tablet (10 mg total) by mouth 3 (three) times daily. 90 tablet 0   etodolac  (LODINE ) 400 MG tablet Take 1 tablet (400 mg total) by mouth 2 (two) times daily. 60 tablet 0   gabapentin  (NEURONTIN ) 600 MG tablet TAKE 1 TABLET BY MOUTH THREE TIMES A DAY 90 tablet 0   [name]  20 MG/0.4ML SOAJ ADMINISTER 1 INJECTION UNDER THE SKIN EVERY 30  DAYS 0.4 mL 2   Vitamin D , Ergocalciferol , (DRISDOL ) 1.25 MG (50000 UNIT) CAPS capsule Take 1 capsule (50,000 Units total) by mouth every 7 (seven) days. 13 capsule 1   No facility-administered medications prior to visit.     PAST MEDICAL HISTORY: Past Medical History:  Diagnosis Date   MS (multiple sclerosis) (HCC)      PAST SURGICAL HISTORY: No past surgical history on file.   FAMILY HISTORY: Family History  Problem Relation Age of Onset   Parkinson's disease Maternal Grandmother      SOCIAL HISTORY: Social History   Socioeconomic History   Marital status: Single    Spouse name: Not on file   Number of children: 6   Years of education: Not on file   Highest education level: Not on file  Occupational History   Not on file  Tobacco Use   Smoking status: Former    Types: Cigarettes   Smokeless tobacco: Never  Vaping Use   Vaping status: Never Used  Substance and Sexual Activity   Alcohol use: Yes    Alcohol/week: 1.0 standard drink of alcohol    Types: 1 Standard drinks or equivalent per week   Drug use: Yes    Frequency: 5.0 times per week    Types: Marijuana   Sexual activity: Yes    Birth control/protection: None  Other Topics Concern   Not on file  Social History Narrative   Not on file   Social Drivers of Health   Financial Resource Strain: Low Risk  (08/07/2023)   Overall Financial Resource Strain (CARDIA)    Difficulty of Paying Living Expenses: Not hard at all  Food Insecurity: No Food Insecurity (08/07/2023)   Hunger Vital Sign    Worried About Running Out of Food in the Last Year: Never true    Ran Out of Food in the Last Year: Never true  Transportation Needs: No Transportation Needs (08/07/2023)   PRAPARE - Administrator, Civil Service (Medical): No    Lack of Transportation (Non-Medical): No  Physical Activity: Inactive (02/07/2024)   Exercise Vital Sign    Days of Exercise per Week: 0 days    Minutes of Exercise per Session:  0 min  Stress: Not on file  Social Connections: Not on file  Intimate Partner Violence: Not At Risk (08/07/2023)   Humiliation, Afraid, Rape, and Kick questionnaire    Fear of Current  or Ex-Partner: No    Emotionally Abused: No    Physically Abused: No    Sexually Abused: No     PHYSICAL EXAM  There were no vitals filed for this visit. There is no height or weight on file to calculate BMI.  Generalized: Well developed, in no acute distress  Cardiology: normal rate and rhythm, no murmur auscultated  Respiratory: clear to auscultation bilaterally    Neurological examination  Mentation: Alert oriented to time, place, history taking. Follows all commands speech and language fluent Cranial nerve II-XII: Pupils were equal round reactive to light. Extraocular movements were full, visual field were full on confrontational test. Facial sensation and strength were normal. Uvula tongue midline. Head turning and shoulder shrug  were normal and symmetric. Motor: The motor testing reveals 5 over 5 strength of all 4 extremities. Good symmetric motor tone is noted throughout.  Sensory: Sensory testing is intact to soft touch on all 4 extremities. No evidence of extinction is noted.  Coordination: Cerebellar testing reveals good finger-nose-finger and heel-to-shin bilaterally.  Gait and station: Gait is normal. Tandem gait is normal. Romberg is negative. No drift is seen.  Reflexes: Deep tendon reflexes are symmetric and normal bilaterally.    DIAGNOSTIC DATA (LABS, IMAGING, TESTING) - I reviewed patient records, labs, notes, testing and imaging myself where available.  Lab Results  Component Value Date   WBC 4.4 08/07/2023   HGB 14.5 08/07/2023   HCT 43.8 08/07/2023   MCV 90 08/07/2023   PLT 231 08/07/2023      Component Value Date/Time   NA 138 08/07/2023 1036   K 4.7 08/07/2023 1036   CL 100 08/07/2023 1036   CO2 25 08/07/2023 1036   GLUCOSE 79 08/07/2023 1036   GLUCOSE 76  02/15/2022 0946   BUN 13 08/07/2023 1036   CREATININE 1.29 (H) 08/07/2023 1036   CALCIUM 10.1 08/07/2023 1036   PROT 6.9 08/07/2023 1036   ALBUMIN 4.7 08/07/2023 1036   AST 21 08/07/2023 1036   ALT 12 08/07/2023 1036   ALKPHOS 61 08/07/2023 1036   BILITOT 1.0 08/07/2023 1036   GFRNONAA >60 02/15/2022 0946   Lab Results  Component Value Date   CHOL 224 (H) 08/07/2023   HDL 74 08/07/2023   LDLCALC 138 (H) 08/07/2023   TRIG 69 08/07/2023   CHOLHDL 3.0 08/07/2023   No results found for: HGBA1C No results found for: VITAMINB12 Lab Results  Component Value Date   TSH 0.648 08/07/2023        No data to display               No data to display           ASSESSMENT AND PLAN  38 y.o. year old male  has a past medical history of MS (multiple sclerosis) (HCC). here with    No diagnosis found.  Norvel Beer ***.   We will continue [name]  every month, baclofen  10mg  up to TID, gabapentin  600mg  TID and etodolac  400mg  BID. Will update labs, today. Healthy lifestyle habits encouraged. He will follow up with PCP as directed. He will return to see Dr Godwin Lat in 6 months, sooner if needed. He verbalizes understanding and agreement with this plan.    No orders of the defined types were placed in this encounter.    No orders of the defined types were placed in this encounter.    Terrilyn Fick, MSN, FNP-C 05/29/2024, 3:51 PM  [facility] [address], [address]  [place], [place] [address] [phone]

## 2024-06-02 ENCOUNTER — Encounter: Payer: Self-pay | Admitting: Family Medicine

## 2024-06-02 ENCOUNTER — Telehealth: Payer: Self-pay | Admitting: Neurology

## 2024-06-02 ENCOUNTER — Ambulatory Visit: Payer: Medicaid Other | Admitting: Family Medicine

## 2024-06-02 DIAGNOSIS — G35 Multiple sclerosis: Secondary | ICD-10-CM

## 2024-06-02 DIAGNOSIS — Z79899 Other long term (current) drug therapy: Secondary | ICD-10-CM

## 2024-06-02 DIAGNOSIS — R208 Other disturbances of skin sensation: Secondary | ICD-10-CM

## 2024-06-02 DIAGNOSIS — E559 Vitamin D deficiency, unspecified: Secondary | ICD-10-CM

## 2024-06-02 DIAGNOSIS — Z7960 Long term (current) use of unspecified immunomodulators and immunosuppressants: Secondary | ICD-10-CM

## 2024-06-02 DIAGNOSIS — R269 Unspecified abnormalities of gait and mobility: Secondary | ICD-10-CM

## 2024-06-02 DIAGNOSIS — R5383 Other fatigue: Secondary | ICD-10-CM

## 2024-06-02 DIAGNOSIS — G4700 Insomnia, unspecified: Secondary | ICD-10-CM

## 2024-06-02 MED ORDER — GABAPENTIN 600 MG PO TABS
600.0000 mg | ORAL_TABLET | Freq: Three times a day (TID) | ORAL | 0 refills | Status: DC
Start: 2024-06-02 — End: 2024-07-17

## 2024-06-02 MED ORDER — BACLOFEN 10 MG PO TABS: 10.0000 mg | ORAL_TABLET | Freq: Three times a day (TID) | ORAL | 0 refills | Status: DC

## 2024-06-02 NOTE — Telephone Encounter (Signed)
 Pt called to request medication refill   baclofen  (LIORESAL ) 10 MG tablet  gabapentin  (NEURONTIN ) 600 MG tablet   Pt would like medication to be sent to:   [id number] [place] [place] - [address] (Ph: [phone])

## 2024-06-02 NOTE — Telephone Encounter (Addendum)
 Last seen on 11/12/23 Follow up scheduled on 06/02/24   Pt informed that both Rx's have been filled on 05/20/24 and ready for pick up. [facility] [address]

## 2024-06-02 NOTE — Telephone Encounter (Signed)
 error

## 2024-06-09 DIAGNOSIS — M25531 Pain in right wrist: Secondary | ICD-10-CM | POA: Diagnosis not present

## 2024-06-11 ENCOUNTER — Encounter: Payer: Self-pay | Admitting: Emergency Medicine

## 2024-06-11 ENCOUNTER — Ambulatory Visit
Admission: EM | Admit: 2024-06-11 | Discharge: 2024-06-11 | Disposition: A | Discharge status: Home / Self Care | Attending: Physician Assistant | Admitting: Physician Assistant

## 2024-06-11 DIAGNOSIS — L0231 Cutaneous abscess of buttock: Secondary | ICD-10-CM

## 2024-06-11 MED ORDER — DOXYCYCLINE HYCLATE 100 MG PO CAPS: 100.0000 mg | ORAL_CAPSULE | Freq: Two times a day (BID) | ORAL | 0 refills | Status: DC

## 2024-06-11 MED ORDER — PREDNISONE 10 MG PO TABS: ORAL_TABLET | ORAL | 0 refills | Status: DC

## 2024-06-11 NOTE — ED Triage Notes (Signed)
 Pt c/o abscess on right buttock   St's first noticed approx 2 weeks ago  No drainage noted

## 2024-06-11 NOTE — Discharge Instructions (Addendum)
 Soak area 20 minutes 4 times a day.  You can soak in tub, shower or in a sitz bath pan. Take antibiotic as directed.  Follow up with your Orthopaedist as scheduled

## 2024-06-11 NOTE — ED Provider Notes (Signed)
 [facility]    CSN: [id number] Arrival date & time: 06/11/24  1540      History   Chief Complaint Chief Complaint  Patient presents with   Abscess    HPI Alvin Williams is a 38 y.o. male.   Patient complains of an abscess on his right buttock.  Patient also would like to be treated for tendinitis to his right shoulder and pain to his right wrist.  Patient reports he has been seen by the orthopedist and is scheduled for injection of steroids but feels like he cannot wait.  Patient does not want abscess drained.  He had rather take antibiotics.  Patient denies any fever or chills he denies any nausea or vomiting patient has past medical history of MS.  The history is provided by the patient. No language interpreter was used.  Abscess   Past Medical History:  Diagnosis Date   MS (multiple sclerosis) [facility])     Patient Active Problem List   Diagnosis Date Noted   Vitamin D  deficiency 02/07/2024   Left wrist pain 02/07/2024   Encounter for monitoring immunomodulating therapy 11/12/2023   Gait disturbance 11/12/2023   Moderate episode of recurrent major depressive disorder (HCC) 08/07/2023   Routine medical exam 08/07/2023   Multiple sclerosis (HCC) 07/11/2022    History reviewed. No pertinent surgical history.     Home Medications    Prior to Admission medications   Medication Sig Start Date End Date Taking? Authorizing Provider  doxycycline (VIBRAMYCIN) 100 MG capsule Take 1 capsule (100 mg total) by mouth 2 (two) times daily. 06/11/24  Yes Flint Raring K, PA-C  predniSONE  (DELTASONE ) 10 MG tablet 6,5,4,3,2,1 taper 06/11/24  Yes Elsey Holts K, PA-C  baclofen  (LIORESAL ) 10 MG tablet Take 1 tablet (10 mg total) by mouth 3 (three) times daily. 06/02/24   Sater, Charlie LABOR, MD  etodolac  (LODINE ) 400 MG tablet Take 1 tablet (400 mg total) by mouth 2 (two) times daily. 04/26/24   Camara, Amadou, MD  gabapentin  (NEURONTIN ) 600 MG tablet Take 1 tablet (600 mg total)  by mouth 3 (three) times daily. 06/02/24   Sater, Charlie LABOR, MD  [name]  20 MG/0.4ML SOAJ ADMINISTER 1 INJECTION UNDER THE SKIN EVERY 30 DAYS 03/18/24   Sater, Charlie LABOR, MD  Vitamin D , Ergocalciferol , (DRISDOL ) 1.25 MG (50000 UNIT) CAPS capsule Take 1 capsule (50,000 Units total) by mouth every 7 (seven) days. 07/10/23   Sater, Charlie LABOR, MD    Family History Family History  Problem Relation Age of Onset   Parkinson's disease Maternal Grandmother     Social History Social History   Tobacco Use   Smoking status: Former    Types: Cigarettes   Smokeless tobacco: Never  Vaping Use   Vaping status: Never Used  Substance Use Topics   Alcohol use: Yes    Alcohol/week: 1.0 standard drink of alcohol    Types: 1 Standard drinks or equivalent per week   Drug use: Yes    Frequency: 5.0 times per week    Types: Marijuana     Allergies   Patient has no known allergies.   Review of Systems Review of Systems  All other systems reviewed and are negative.    Physical Exam Triage Vital Signs ED Triage Vitals [06/11/24 1733]  Encounter Vitals Group     BP 106/68     Girls Systolic BP Percentile      Girls Diastolic BP Percentile      Boys Systolic BP Percentile  Boys Diastolic BP Percentile      Pulse Rate 76     Resp 16     Temp 98 F (36.7 C)     Temp Source Oral     SpO2 96 %     Weight      Height      Head Circumference      Peak Flow      Pain Score 6     Pain Loc      Pain Education      Exclude from Growth Chart    No data found.  Updated Vital Signs BP 106/68 (BP Location: Right Arm)   Pulse 76   Temp 98 F (36.7 C) (Oral)   Resp 16   SpO2 96%   Visual Acuity Right Eye Distance:   Left Eye Distance:   Bilateral Distance:    Right Eye Near:   Left Eye Near:    Bilateral Near:     Physical Exam Vitals and nursing note reviewed.  Constitutional:      Appearance: He is well-developed.  HENT:     Head: Normocephalic.   Cardiovascular:      Rate and Rhythm: Normal rate.  Pulmonary:     Effort: Pulmonary effort is normal.  Abdominal:     General: There is no distension.   Musculoskeletal:        General: Swelling and tenderness present.     Cervical back: Normal range of motion.     Comments: 1 cm area of swelling right buttock, tender small pimple center.  Pain with range of motion of right shoulder no deformity.  Discomfort with movement.  Right wrist full range of motion no deformity neurovascular neurosensory intact   Skin:    General: Skin is warm.   Neurological:     General: No focal deficit present.     Mental Status: He is alert and oriented to person, place, and time.      UC Treatments / Results  Labs (all labs ordered are listed, but only abnormal results are displayed) Labs Reviewed - No data to display  EKG   Radiology No results found.  Procedures Procedures (including critical care time)  Medications Ordered in UC Medications - No data to display  Initial Impression / Assessment and Plan / UC Course  I have reviewed the triage vital signs and the nursing notes.  Pertinent labs & imaging results that were available during my care of the patient were reviewed by me and considered in my medical decision making (see chart for details).     Patient has previously had x-rays of his shoulder and his wrist.  He reports he is following with orthopedist.  Patient would like to try a dose of oral steroids to see if this will help with his symptoms.  Patient is given a prescription for doxycycline he is advised to soak area 20 minutes 4 times a day.  Patient is advised to follow-up with his orthopedist for his shoulder and his wrist he is given a prescription for prednisone .  Patient is discharged in stable condition. Final Clinical Impressions(s) / UC Diagnoses   Final diagnoses:  Abscess of buttock, left     Discharge Instructions      Soak area 20 minutes 4 times a day.  You can soak in tub,  shower or in a sitz bath pan. Take antibiotic as directed.  Follow up with your Orthopaedist as scheduled   ED Prescriptions  Medication Sig Dispense Auth. Provider   predniSONE  (DELTASONE ) 10 MG tablet 6,5,4,3,2,1 taper 21 tablet Astrid Vides K, PA-C   doxycycline (VIBRAMYCIN) 100 MG capsule Take 1 capsule (100 mg total) by mouth 2 (two) times daily. 20 capsule Darol Cush K, PA-C      PDMP not reviewed this encounter.An After Visit Summary was printed and given to the patient.       Flint Sonny POUR, PA-C 06/11/24 [facility]

## 2024-06-11 NOTE — ED Notes (Signed)
 Pt st's he would also like to be evaluated for his right wrist and right shoulder pain.  Darice Showers, PA made aware

## 2024-07-17 ENCOUNTER — Telehealth: Payer: Self-pay | Admitting: Neurology

## 2024-07-17 ENCOUNTER — Other Ambulatory Visit: Payer: Self-pay

## 2024-07-17 MED ORDER — GABAPENTIN 600 MG PO TABS: 600.0000 mg | ORAL_TABLET | Freq: Three times a day (TID) | ORAL | 0 refills | Status: DC

## 2024-07-17 MED ORDER — ETODOLAC 400 MG PO TABS: 400.0000 mg | ORAL_TABLET | Freq: Two times a day (BID) | ORAL | 0 refills | Status: DC

## 2024-07-17 MED ORDER — VITAMIN D (ERGOCALCIFEROL) 1.25 MG (50000 UNIT) PO CAPS: 50000.0000 [IU] | ORAL_CAPSULE | ORAL | 1 refills | Status: DC

## 2024-07-17 NOTE — Telephone Encounter (Signed)
 Called pt and let him know that we never prescribed him Meloxicam. Sent in medication to pharmacy.

## 2024-07-17 NOTE — Telephone Encounter (Signed)
 Pt called to request refillof Medication gabapentin  (NEURONTIN ) 600 MG tablet  Vitamin D , Ergocalciferol , (DRISDOL ) 1.25 MG (50000 UNIT) CAPS capsule   Melazepam  Pt is  asking for refill for this Medication however this medication is not on list   Pt  would like [facility] to [facility] to   [id number] [place], [place] - [address] (Ph: [phone])

## 2024-07-29 ENCOUNTER — Other Ambulatory Visit: Payer: Self-pay | Admitting: Neurology

## 2024-07-29 NOTE — Telephone Encounter (Signed)
 Last seen on 11/11/23 No follow up scheduled    Dispensed Days Supply Quantity Provider Pharmacy  [name] 20 MG/0.4 ML PEN 07/14/2024 30 0.4 mL Sater, Charlie LABOR, MD [facility]      My chart message sent to schedule an updated visit.

## 2024-08-19 ENCOUNTER — Telehealth: Payer: Self-pay | Admitting: Neurology

## 2024-08-19 NOTE — Telephone Encounter (Signed)
 Pt reports he is no longer able to work, he states it hurts when he works.  He would like a letter from Dr Vear stating what he is seen for and that he is unable to work any longer, please call pt to discuss this request.

## 2024-08-20 NOTE — Telephone Encounter (Signed)
 Dr. Vear- are you able to provide this?

## 2024-08-21 NOTE — Telephone Encounter (Signed)
 Lvm by hf 1st attempt

## 2024-08-21 NOTE — Telephone Encounter (Signed)
 Pt has returned call to [facility]

## 2024-08-21 NOTE — Telephone Encounter (Signed)
 Pt called back to follow up about request and also to see if Pt can be seen sooner . Pt has been having some really bad MS  symptoms the patient stated pt legs are in pain  to the point is hard to walk .  Also Pt feels like MS is getting worse but is not sure ,Pt is requesting to be seen sooner .

## 2024-08-21 NOTE — Telephone Encounter (Signed)
 Patient returning phone call. Patient stated MS is getting worse. Would like a call back today because office will be closed tomorrow.

## 2024-08-22 NOTE — Telephone Encounter (Signed)
 Spoke w/Pt who stated he doesn't think the [name]  is working. His legs are hurting more and he is unable to work d/t the pain. Pt stated he has tingling in his arms and hands and pain more in his hands. He would like to be seen sooner than 11/14 if possible. Informed Pt will send message to provider for possible recommendations and we will reach out to him if provider has recommendations. I did not see any appts available but added Pt to the cancellation wait list. Pt voiced thanks for the call back.

## 2024-08-25 ENCOUNTER — Telehealth: Payer: Self-pay | Admitting: Neurology

## 2024-08-25 NOTE — Telephone Encounter (Signed)
 Phone room: Pt has appt with Dr. Vear tomorrow morning, Please call back and let him know refill request will be addressed at appt tomorrow

## 2024-08-25 NOTE — Telephone Encounter (Signed)
 Called pt. Scheduled work in for 08/26/24 at 9am, check in no later than 845am. Pt verbalized understanding.

## 2024-08-25 NOTE — Telephone Encounter (Signed)
 Pt called to request medication refill gabapentin  (NEURONTIN ) 600 MG tablet   Pt would like medications sent  to   [id number] [place] [place] - [address] (Ph: [phone])

## 2024-08-26 ENCOUNTER — Telehealth: Payer: Self-pay

## 2024-08-26 ENCOUNTER — Encounter: Payer: Self-pay | Admitting: Neurology

## 2024-08-26 ENCOUNTER — Ambulatory Visit: Admitting: Neurology

## 2024-08-26 NOTE — Telephone Encounter (Signed)
 Called pt at 9:00am to see where he was for his 9:00am appointment today. Pt stated that he thought his appointment was at 10:00am,told pt that it was discussed yesterday that his appointment was at 9 and he needed to be here at 8:45am. Pt states you know I have MS and I be having memory fog told pt if he got in the car and made his way up her he can be seen, pt states he is away, told pt Dr. Vear will still see him if he gets to the office by 9:45am.

## 2024-08-27 ENCOUNTER — Other Ambulatory Visit: Payer: Self-pay | Admitting: *Deleted

## 2024-08-27 DIAGNOSIS — G35 Multiple sclerosis: Secondary | ICD-10-CM

## 2024-08-27 MED ORDER — KESIMPTA 20 MG/0.4ML ~~LOC~~ SOAJ: 0.4000 mL | SUBCUTANEOUS | 0 refills | Status: DC

## 2024-08-28 ENCOUNTER — Other Ambulatory Visit: Payer: Self-pay

## 2024-08-28 ENCOUNTER — Other Ambulatory Visit: Payer: Self-pay | Admitting: Neurology

## 2024-09-16 ENCOUNTER — Ambulatory Visit: Admitting: Neurology

## 2024-09-16 ENCOUNTER — Encounter: Payer: Self-pay | Admitting: Neurology

## 2024-09-16 VITALS — BP 112/74 | HR 52 | Ht 69.5 in | Wt 153.5 lb

## 2024-09-16 DIAGNOSIS — R208 Other disturbances of skin sensation: Secondary | ICD-10-CM

## 2024-09-16 DIAGNOSIS — G35D Multiple sclerosis, unspecified: Secondary | ICD-10-CM

## 2024-09-16 DIAGNOSIS — R269 Unspecified abnormalities of gait and mobility: Secondary | ICD-10-CM

## 2024-09-16 DIAGNOSIS — G35 Multiple sclerosis: Secondary | ICD-10-CM

## 2024-09-16 DIAGNOSIS — E559 Vitamin D deficiency, unspecified: Secondary | ICD-10-CM

## 2024-09-16 DIAGNOSIS — R5383 Other fatigue: Secondary | ICD-10-CM | POA: Diagnosis not present

## 2024-09-16 DIAGNOSIS — Z5181 Encounter for therapeutic drug level monitoring: Secondary | ICD-10-CM

## 2024-09-16 DIAGNOSIS — Z7960 Long term (current) use of unspecified immunomodulators and immunosuppressants: Secondary | ICD-10-CM

## 2024-09-16 DIAGNOSIS — Z79899 Other long term (current) drug therapy: Secondary | ICD-10-CM

## 2024-09-16 MED ORDER — GABAPENTIN 600 MG PO TABS: 600.0000 mg | ORAL_TABLET | Freq: Three times a day (TID) | ORAL | 5 refills | Status: DC

## 2024-09-16 MED ORDER — BACLOFEN 10 MG PO TABS: 10.0000 mg | ORAL_TABLET | Freq: Four times a day (QID) | ORAL | 1 refills | Status: DC

## 2024-09-16 NOTE — Progress Notes (Signed)
 [facility]  PATIENT: Alvin Williams DOB: [birth year]  REFERRING DOCTOR OR PCP: Havery Rumble MD SOURCE: Patient, notes from Dr. Rumble, laboratory reports, MRI reports, MRI images personally reviewed.  _________________________________   HISTORICAL  CHIEF COMPLAINT:  Chief Complaint  Patient presents with   RM 10     Patient is here alone for MS follow-up - patient feels his MS has gotten worse and is currently not working due to dizzy spells at work and was over heated, uses marijuana to help with pain and want to discuss applying for disability. Constant pain daily and now has ankle pain    Other    Has 7 kids and no job due to not being able to function while at work     HISTORY OF PRESENT ILLNESS:  Alvin Williams is a 38 y.o. man who was diagnosed with MS in 2020.    Update 09/16/24: He switched to Ksimpta (was on Tysabri ) and tolerates it well.    He denies any exacerbations but legs are hurting more and are more stiff.  Currently, he notes his gait is off balanced.  SABRA   He uses the bannister on stairs.  He has pain in both legs., left > right.  He notes some leg numbness.   He notes spasticity in leg muscles  He also notes milder arm pain and numbness   He will sometimes drop items.     He has urinary frequency but no incontinence.    He denies any issues with his vision currently.SABRA  He had nystagmus at the time of diagnosis.   He had an episode of vertigo/dizziness at work   He started staggering and fell on his way out.   He reports he was fired due to these symptoms.  He felt better later that day.  He has bilateral leg pain, L>R, and worse in the back of the upper legs.   His legs are a little stiff.   He is on baclofen  10 mg po tid and gabapentin  600 mg po tid.  He is not sure if gabapentin  400 mg po tid has helped.  He tolerates it well.      Additionally, he notes reduced memory and focus.  He has some depression and anxiety.   He feels he sleeps well  most nights.  No snoring.   He notes fatigue.     He is otherwise in good medical health.       MS HISTORY In 2019 he had an episode of numbness and pain in the legs x 2 weeks.  One month later, he had symptoms in the left hand x 1 month.   A couple months later, while at work he noted vision was blurry when he would turn his head for a few seconds at a time and his vision was jerking.     He was referred to Dr, Rumble and then had MRI's performed that were consistent with MS.  SABRA   He was placed on Tysabri  and continues on it.    He has done well with no exacerbation.    He has been doing infusions at [facility].    Last infusion was 2 weeks ago.   By report, he is JCV positive and the Tysabri  was stretched to every 6 weeks.   He was on Tysabri  with a low positive titer (0.88 on 12/13/2022) every 6 weeks.     Last infusion was late January 2024.  We switched to  [name]  in May 2024.   Imaging:  MRI of the brain 10/20/2021 showed multiple T2/FLAIR hyperintense foci in the cerebral hemispheres, predominantly in the periventricular white matter radially oriented to the ventricles.  Foci also noted in the right cerebellar hemisphere, and right posterior pons.    MRI of the cervical spine 10/20/2021 showed T2 hyperintense foci within the spinal cord adjacent to C1-C2, adjacent to C3 centrally and towards the left, adjacent to C3 posterolaterally to the right (small), adjacent to C4-C5 posterolaterally to the right  MRI of the brain 05/16/2019 showed large enhancing lesions in the periventricular white matter of both posterior frontal/parietal lobes and another small T2 hyperintense foci in the periventricular white matter.    MRI of the cervical spine 05/16/2019 shows foci adjacent to C1-C2 and adjacent to C2-C3 (enhancing) and C5.  REVIEW OF SYSTEMS: Constitutional: No fevers, chills, sweats, or change in appetite.  He has fatigue.  He has insomnia. Eyes: No visual changes, double vision, eye  pain Ear, nose and throat: No hearing loss, ear pain, nasal congestion, sore throat Cardiovascular: No chest pain, palpitations Respiratory:  No shortness of breath at rest or with exertion.   No wheezes GastrointestinaI: No nausea, vomiting, diarrhea, abdominal pain, fecal incontinence Genitourinary:  No dysuria, urinary retention or frequency.  No nocturia. Musculoskeletal: No neck pain.  He notes some back pain. Integumentary: No rash, pruritus, skin lesions Neurological: as above Psychiatric: No depression at this time.  No anxiety Endocrine: No palpitations, diaphoresis, change in appetite, change in weigh or increased thirst Hematologic/Lymphatic:  No anemia, purpura, petechiae. Allergic/Immunologic: No itchy/runny eyes, nasal congestion, recent allergic reactions, rashes  ALLERGIES: No Known Allergies  HOME MEDICATIONS:  Current Outpatient Medications:    etodolac  (LODINE ) 400 MG tablet, Take 1 tablet (400 mg total) by mouth 2 (two) times daily., Disp: 60 tablet, Rfl: 0   [name]  20 MG/0.4ML SOAJ, Inject 0.4 mLs into the skin every 30 (thirty) days. Must keep follow up 09/16/24 for ongoing refills, Disp: 0.4 mL, Rfl: 0   Vitamin D , Ergocalciferol , (DRISDOL ) 1.25 MG (50000 UNIT) CAPS capsule, Take 1 capsule (50,000 Units total) by mouth every 7 (seven) days., Disp: 13 capsule, Rfl: 1   baclofen  (LIORESAL ) 10 MG tablet, Take 1 tablet (10 mg total) by mouth 4 (four) times daily., Disp: 360 tablet, Rfl: 1   doxycycline  (VIBRAMYCIN ) 100 MG capsule, Take 1 capsule (100 mg total) by mouth 2 (two) times daily. (Patient not taking: Reported on 09/16/2024), Disp: 20 capsule, Rfl: 0   gabapentin  (NEURONTIN ) 600 MG tablet, Take 1 tablet (600 mg total) by mouth 3 (three) times daily., Disp: 90 tablet, Rfl: 5   predniSONE  (DELTASONE ) 10 MG tablet, 6,5,4,3,2,1 taper (Patient not taking: Reported on 09/16/2024), Disp: 21 tablet, Rfl: 0  PAST MEDICAL HISTORY: Past Medical History:  Diagnosis Date    MS (multiple sclerosis)     PAST SURGICAL HISTORY: History reviewed. No pertinent surgical history.  FAMILY HISTORY: Family History  Problem Relation Age of Onset   Parkinson's disease Maternal Grandmother     SOCIAL HISTORY: Social History   Socioeconomic History   Marital status: Single    Spouse name: Not on file   Number of children: 6   Years of education: Not on file   Highest education level: Not on file  Occupational History   Not on file  Tobacco Use   Smoking status: Former    Types: Cigarettes   Smokeless tobacco: Never   Tobacco comments:  19 years cigarette free   Vaping Use   Vaping status: Never Used  Substance and Sexual Activity   Alcohol use: Never    Alcohol/week: 1.0 standard drink of alcohol    Types: 1 Standard drinks or equivalent per week   Drug use: Yes    Frequency: 5.0 times per week    Types: Marijuana   Sexual activity: Not Currently    Birth control/protection: None  Other Topics Concern   Not on file  Social History Narrative   Caffeine use: water and Gatorade only     Social Drivers of Health   Financial Resource Strain: Low Risk  (08/07/2023)   Overall Financial Resource Strain (CARDIA)    Difficulty of Paying Living Expenses: Not hard at all  Food Insecurity: No Food Insecurity (08/07/2023)   Hunger Vital Sign    Worried About Running Out of Food in the Last Year: Never true    Ran Out of Food in the Last Year: Never true  Transportation Needs: No Transportation Needs (08/07/2023)   PRAPARE - Administrator, Civil Service (Medical): No    Lack of Transportation (Non-Medical): No  Physical Activity: Inactive (02/07/2024)   Exercise Vital Sign    Days of Exercise per Week: 0 days    Minutes of Exercise per Session: 0 min  Stress: Not on file  Social Connections: Not on file  Intimate Partner Violence: Not At Risk (08/07/2023)   Humiliation, Afraid, Rape, and Kick questionnaire    Fear of Current or  Ex-Partner: No    Emotionally Abused: No    Physically Abused: No    Sexually Abused: No       PHYSICAL EXAM  Vitals:   09/16/24 1100  BP: 112/74  Pulse: (!) 52  SpO2: 96%  Weight: 153 lb 8 oz (69.6 kg)  Height: 5' 9.5 (1.765 m)    Body mass index is 22.34 kg/m.    General: The patient is well-developed and well-nourished and in no acute distress  HEENT:  Head is [place]/AT.  Sclera are anicteric.   Skin: Extremities are without rash or  edema.  Neurologic Exam  Mental status: The patient is alert and oriented x 3 at the time of the examination. The patient has apparent normal recent and remote memory, with an apparently normal attention span and concentration ability.   Speech is normal.  Cranial nerves: Extraocular movements are full. Pupils are equal, round, and reactive to light and accomodation.  Visual fields are full though notes acuity is slightly better OS.  Colors are symmetric.    Facial symmetry is present. There is good facial sensation to soft touch bilaterally.Facial strength is normal.  Trapezius and sternocleidomastoid strength is normal. No dysarthria is noted.  No obvious hearing deficits are noted.  Motor:  Muscle bulk is normal.   Tone is mildly increased in the legs.. Strength is  5 / 5 in all 4 extremities.   Sensory: Sensory testing  shows reduced temperature sensation on the left and reduced vibration sensation in right leg.    Coordination: Cerebellar testing reveals good finger-nose-finger and slightly reduced heel-to-shin bilaterally.  Gait and station: Station is normal.   Gait is slightly wide.  Tandem gait is moderately wide.  Romberg is borderline.  Reflexes: Deep tendon reflexes are symmetric and normal in the arms and increased at the knees with spread.  No ankle clonus.     DIAGNOSTIC DATA (LABS, IMAGING, TESTING) - I reviewed patient records, labs,  notes, testing and imaging myself where available.  Lab Results  Component Value Date    WBC 4.4 08/07/2023   HGB 14.5 08/07/2023   HCT 43.8 08/07/2023   MCV 90 08/07/2023   PLT 231 08/07/2023      Component Value Date/Time   NA 138 08/07/2023 1036   K 4.7 08/07/2023 1036   CL 100 08/07/2023 1036   CO2 25 08/07/2023 1036   GLUCOSE 79 08/07/2023 1036   GLUCOSE 76 02/15/2022 0946   BUN 13 08/07/2023 1036   CREATININE 1.29 (H) 08/07/2023 1036   CALCIUM 10.1 08/07/2023 1036   PROT 6.9 08/07/2023 1036   ALBUMIN 4.7 08/07/2023 1036   AST 21 08/07/2023 1036   ALT 12 08/07/2023 1036   ALKPHOS 61 08/07/2023 1036   BILITOT 1.0 08/07/2023 1036   GFRNONAA >60 02/15/2022 0946       ASSESSMENT AND PLAN  Multiple sclerosis - Plan: CBC with Differential/Platelet, IgG, IgA, IgM, MR BRAIN W WO CONTRAST, MR CERVICAL SPINE W WO CONTRAST  Encounter for monitoring immunomodulating therapy - Plan: CBC with Differential/Platelet, IgG, IgA, IgM  Gait disturbance - Plan: MR BRAIN W WO CONTRAST, MR CERVICAL SPINE W WO CONTRAST  High risk medication use - Plan: CBC with Differential/Platelet, IgG, IgA, IgM  Dysesthesia - Plan: MR BRAIN W WO CONTRAST, MR CERVICAL SPINE W WO CONTRAST  Other fatigue  Vitamin D  deficiency - Plan: VITAMIN D  25 Hydroxy (Vit-D Deficiency, Fractures)  Continue [name] .  Check labs.  Due to additional symptoms, we will also check MRI of the brain and cervical spine to determine if we need to switch his disease modifying therapy. He is experiencing more muscle spasticity and pain.  I will increase the gabapentin  from 10 mg twice a day to 10 mg up to 4 pills a day.  Continue gabapentin  600 mg 3 times a day.  Due to physical impairments from his MS including reduced gait and balance and vertigo and cognitive impairments including fatigue, he is unable to work. Rtc 6 months or sooner if new or worsening issues  Marily Konczal A. Vear, MD, [facility] 09/16/2024, 6:41 PM Certified in Neurology, Clinical Neurophysiology, Sleep Medicine and Neuroimaging  [facility] [address], [address] [place], [place] [address] [phone]

## 2024-09-17 ENCOUNTER — Ambulatory Visit: Payer: Self-pay | Admitting: Neurology

## 2024-09-17 LAB — CBC WITH DIFFERENTIAL/PLATELET
Basophils Absolute: 0 x10E3/uL (ref 0.0–0.2)
Basos: 0 %
EOS (ABSOLUTE): 0.4 x10E3/uL (ref 0.0–0.4)
Eos: 7 %
Hematocrit: 45.7 % (ref 37.5–51.0)
Hemoglobin: 15 g/dL (ref 13.0–17.7)
Immature Grans (Abs): 0 x10E3/uL (ref 0.0–0.1)
Immature Granulocytes: 0 %
Lymphocytes Absolute: 1.7 x10E3/uL (ref 0.7–3.1)
Lymphs: 30 %
MCH: 30.8 pg (ref 26.6–33.0)
MCHC: 32.8 g/dL (ref 31.5–35.7)
MCV: 94 fL (ref 79–97)
Monocytes Absolute: 0.5 x10E3/uL (ref 0.1–0.9)
Monocytes: 9 %
Neutrophils Absolute: 3.1 x10E3/uL (ref 1.4–7.0)
Neutrophils: 54 %
Platelets: 232 x10E3/uL (ref 150–450)
RBC: 4.87 x10E6/uL (ref 4.14–5.80)
RDW: 12.4 % (ref 11.6–15.4)
WBC: 5.6 x10E3/uL (ref 3.4–10.8)

## 2024-09-17 LAB — VITAMIN D 25 HYDROXY (VIT D DEFICIENCY, FRACTURES): Vit D, 25-Hydroxy: 43.3 ng/mL (ref 30.0–100.0)

## 2024-09-17 LAB — IGG, IGA, IGM
IgA/Immunoglobulin A, Serum: 166 mg/dL (ref 90–386)
IgG (Immunoglobin G), Serum: 952 mg/dL (ref 603–1613)
IgM (Immunoglobulin M), Srm: 69 mg/dL (ref 20–172)

## 2024-09-22 ENCOUNTER — Other Ambulatory Visit: Payer: Self-pay | Admitting: Neurology

## 2024-09-22 ENCOUNTER — Telehealth: Payer: Self-pay | Admitting: Neurology

## 2024-09-22 MED ORDER — ETODOLAC 400 MG PO TABS: 400.0000 mg | ORAL_TABLET | Freq: Two times a day (BID) | ORAL | 5 refills | Status: DC

## 2024-09-22 NOTE — Telephone Encounter (Signed)
 Dr. Vear- I do not see this mentioned in last office visit. Can you review?

## 2024-09-22 NOTE — Telephone Encounter (Signed)
 Pt called to request medication refill etodolac  (LODINE ) 400 MG tablet   Pt would like Medication [facility]   [id number] [place], [place] - [address] Phone: [phone]  Fax: [phone]

## 2024-09-23 ENCOUNTER — Telehealth: Payer: Self-pay | Admitting: Neurology

## 2024-09-23 NOTE — Telephone Encounter (Signed)
  Patient called for refills on baclofen  (LIORESAL ) 10 MG tablet and gabapentin  (NEURONTIN ) 600 MG tablet. Informed patient medications was sent to the pharmacy on 09/16/24. Call pharmacy, if do not have refills please call back to put in request.

## 2024-09-29 ENCOUNTER — Telehealth: Payer: Self-pay | Admitting: Neurology

## 2024-09-29 NOTE — Telephone Encounter (Signed)
 Dr.Sater did you want pt to continue vitamin d  Rx? Or take OTC supplement?  Pt was seen on 09/16/24  Please advise

## 2024-09-29 NOTE — Telephone Encounter (Signed)
 Patient request refill for Vitamin D , Ergocalciferol , (DRISDOL ) 1.25 MG (50000 UNIT) CAPS capsule  [phone]

## 2024-09-29 NOTE — Telephone Encounter (Signed)
 [facility] shara: [id number] exp. 09/17/24-11/16/24 sent to GI [phone]

## 2024-09-29 NOTE — Telephone Encounter (Signed)
   [facility] [place] [facility]. [facility] [address] and [facility] are going to get Rx transferred to them.   Pt informed

## 2024-10-21 ENCOUNTER — Other Ambulatory Visit: Payer: Self-pay | Admitting: Neurology

## 2024-10-21 ENCOUNTER — Telehealth: Payer: Self-pay | Admitting: Neurology

## 2024-10-21 DIAGNOSIS — G35D Multiple sclerosis, unspecified: Secondary | ICD-10-CM

## 2024-10-21 NOTE — Telephone Encounter (Signed)
 Patient returned phone call, relayed message, patient verbalized understand and will call [facility]

## 2024-10-21 NOTE — Telephone Encounter (Signed)
 Rx sent 09/22/24 #60, 5 refills. See below. Called pt at [phone]. Mailbox full, unable to LVM.   Phone room: if pt calls let him know to call [facility] and transfer remaining refills to [facility]

## 2024-10-21 NOTE — Telephone Encounter (Signed)
 Last seen on 09/16/24 No 6 month follow up scheduled

## 2024-10-21 NOTE — Telephone Encounter (Signed)
 Pt called to request medication refill etodolac  (LODINE ) 400 MG tablet   Pt medication is to be sent to  [id number] [place] [place] - [address] Phone: [phone]  Fax: [phone]

## 2024-10-24 DIAGNOSIS — M25531 Pain in right wrist: Secondary | ICD-10-CM | POA: Diagnosis not present

## 2024-10-29 ENCOUNTER — Telehealth: Payer: Self-pay | Admitting: Neurology

## 2024-10-29 NOTE — Telephone Encounter (Signed)
 Pt called to request medication  refill  gabapentin  (NEURONTIN ) 600 MG tablet   Pt medication  is to be sent to  [id number] - [place], [place] - [address]

## 2024-10-29 NOTE — Telephone Encounter (Signed)
 Phone room: please call pt back. Pharmacy should have refills on file. He needs to contact pharmacy. Last refill sent 09/16/24 #90, 5 refills.

## 2024-10-31 ENCOUNTER — Ambulatory Visit: Admitting: Family Medicine

## 2024-11-02 ENCOUNTER — Other Ambulatory Visit

## 2024-11-02 ENCOUNTER — Inpatient Hospital Stay: Admission: RE | Admit: 2024-11-02 | Source: Ambulatory Visit

## 2024-11-10 DIAGNOSIS — M25531 Pain in right wrist: Secondary | ICD-10-CM | POA: Diagnosis not present

## 2024-11-17 ENCOUNTER — Other Ambulatory Visit (HOSPITAL_COMMUNITY): Payer: Self-pay

## 2024-11-17 ENCOUNTER — Telehealth: Payer: Self-pay

## 2024-11-17 NOTE — Telephone Encounter (Signed)
 Pharmacy Patient Advocate Encounter   Received notification from CoverMyMeds that prior authorization for [name]  20mg /0.56ml auto-injector is required/requested.   Insurance verification completed.   The patient is insured through [facility] MEDICAID.   Per test claim: PA required; PA started via CoverMyMeds. KEY [id number] . Waiting for clinical questions to populate.

## 2024-11-19 ENCOUNTER — Telehealth: Payer: Self-pay | Admitting: Neurology

## 2024-11-19 MED ORDER — ETODOLAC 400 MG PO TABS: 400.0000 mg | ORAL_TABLET | Freq: Two times a day (BID) | ORAL | 5 refills | Status: DC

## 2024-11-19 NOTE — Telephone Encounter (Signed)
 Pt called to request medication refill etodolac  (LODINE ) 400 MG tablet   Pt medication is to be sent to  [id number] [place] [place] - [address] Phone: [phone]  Fax: [phone]

## 2024-11-19 NOTE — Telephone Encounter (Signed)
 refilled

## 2024-12-04 ENCOUNTER — Telehealth: Payer: Self-pay | Admitting: Neurology

## 2024-12-04 MED ORDER — GABAPENTIN 600 MG PO TABS: 600.0000 mg | ORAL_TABLET | Freq: Three times a day (TID) | ORAL | 5 refills | Status: DC

## 2024-12-04 NOTE — Telephone Encounter (Signed)
 Pt is requesting a refill for gabapentin  (NEURONTIN ) 600 MG tablet .  [phone]

## 2024-12-04 NOTE — Telephone Encounter (Signed)
 REFILLED

## 2024-12-09 ENCOUNTER — Other Ambulatory Visit: Payer: Self-pay

## 2024-12-09 ENCOUNTER — Encounter: Payer: Self-pay | Admitting: Physical Therapy

## 2024-12-09 ENCOUNTER — Ambulatory Visit: Admitting: Physical Therapy

## 2024-12-09 DIAGNOSIS — R600 Localized edema: Secondary | ICD-10-CM | POA: Insufficient documentation

## 2024-12-09 DIAGNOSIS — M62838 Other muscle spasm: Secondary | ICD-10-CM | POA: Insufficient documentation

## 2024-12-09 DIAGNOSIS — M79631 Pain in right forearm: Secondary | ICD-10-CM | POA: Diagnosis not present

## 2024-12-09 DIAGNOSIS — M25631 Stiffness of right wrist, not elsewhere classified: Secondary | ICD-10-CM | POA: Insufficient documentation

## 2024-12-09 DIAGNOSIS — M25511 Pain in right shoulder: Secondary | ICD-10-CM | POA: Diagnosis not present

## 2024-12-09 DIAGNOSIS — G8929 Other chronic pain: Secondary | ICD-10-CM | POA: Insufficient documentation

## 2024-12-09 NOTE — Therapy (Signed)
 " OUTPATIENT PHYSICAL THERAPY SHOULDER EVALUATION   Patient Name: Alvin Williams MRN: [id number] DOB:[birth year], 38 y.o., male Today's Date: 12/09/2024  END OF SESSION:  PT End of Session - 12/09/24 1344     Visit Number 1    Number of Visits 6    Date for Recertification  01/20/25    Authorization Type [employer]    PT Start Time 1345    PT Stop Time 1425    PT Time Calculation (min) 40 min          Past Medical History:  Diagnosis Date   MS (multiple sclerosis)    History reviewed. No pertinent surgical history. Patient Active Problem List   Diagnosis Date Noted   Trigger little finger of left hand 03/20/2024   Vitamin D  deficiency 02/07/2024   Left wrist pain 02/07/2024   Encounter for monitoring immunomodulating therapy 11/12/2023   Gait disturbance 11/12/2023   Moderate episode of recurrent major depressive disorder (HCC) 08/07/2023   Routine medical exam 08/07/2023   Multiple sclerosis 07/11/2022   Chronic neuropathic pain 04/21/2020   General unsteadiness 04/21/2020    PCP: Deitra Morton Sebastian Nena, NP  REFERRING PROVIDER: Barbra Con PARAS, MD  REFERRING DIAG: [phone] (ICD-10-CM) - Pain in right wrist  THERAPY DIAG:  Pain in right forearm  Chronic right shoulder pain  Stiffness of right wrist, not elsewhere classified  Localized edema  Other muscle spasm  Rationale for Evaluation and Treatment: Rehabilitation  ONSET DATE: Reinjury ~2 months ago  SUBJECTIVE:                                                                                                                                                                                      SUBJECTIVE STATEMENT: Pt states he first messed it up years ago while working out. Got steroid shot which helped the pain go away. 2-3 months ago he was lifting tires at work and unloaded wrong and injured his wrist again. Still likes to work out and hit punching bags. Reinjured it again. Was put in a  brace but needs a new one because it got messed up. Went 2 years ago for his R shoulder injury.  Hand dominance: Left  PERTINENT HISTORY: MS 11/10/24 office note had recommended wrist splint for wrist sprain  PAIN:  Are you having pain? Yes: NPRS scale: currently 8 at rest, 10/10 at worst  Pain location: R wrist (volar and dorsal surface) and shoulder Pain description: Ache, sore, throbbing Aggravating factors: Using wrist without brace Relieving factors: Using splint/brace  PRECAUTIONS: None  RED FLAGS: None   WEIGHT BEARING RESTRICTIONS: No  FALLS:  Has patient fallen in  last 6 months? 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  DIAGNOSTIC FINDINGS:  10/24/24 office visit Images: Three-view x-ray of the bilateral hands ordered and reviewed by me does not show any acute or degenerative bony abnormality.   PATIENT SURVEYS:  Quick Dash:  QUICK DASH  Please rate your ability do the following activities in the last week by selecting the number below the appropriate response.   Activities Rating  Open a tight or new jar.  4 = Severe difficulty  Do heavy household chores (e.g., wash walls, floors). 3 = Moderate difficulty  Carry a shopping bag or briefcase 4 = Severe difficulty  Wash your back. 4 = Severe difficulty  Use a knife to cut food. 3 = Moderate difficulty  Recreational activities in which you take some force or impact through your arm, shoulder or hand (e.g., golf, hammering, tennis, etc.). 4 = Severe difficulty  During the past week, to what extent has your arm, shoulder or hand problem interfered with your normal  social activities with family, friends, neighbors or groups?  4 = Quite a bit  During the past week, were you limited in your work or other regular daily activities as a result of your arm, shoulder or hand problem? 3 = Moderately limited  Rate the severity of the following symptoms in the last week: Arm, Shoulder, or hand pain. 5 = Extreme  Rate the severity of the following symptoms in the last week: Tingling (pins and needles) in your arm, shoulder or hand. 1 = none  During the past week, how much difficulty have you had sleeping because of the pain in your arm, shoulder or hand?  4 = Severe difficulty  Score 63.6   (A QuickDASH score may not be calculated if there is greater than 1 missing item.)  Minimally Clinically Important Difference (MCID): 15-20 points  (Franchignoni, F. et al. (2013). Minimally clinically important difference of the disabilities of the arm, shoulder, and hand outcome measures (DASH) and its shortened version (Quick DASH). Journal of Orthopaedic & Sports Physical Therapy, 44(1), 30-39)   COGNITION: Overall cognitive status: Within functional limits for tasks assessed     SENSATION: WFL  POSTURE: Rounded shoulders  UPPER EXTREMITY ROM:   Active ROM Right eval Left eval  Shoulder flexion    Shoulder extension    Shoulder abduction    Shoulder adduction    Shoulder internal rotation    Shoulder external rotation    Elbow flexion    Elbow extension    Wrist flexion 55 75  Wrist extension 45 59  Wrist ulnar deviation 38 38  Wrist radial deviation 15 p! 15  Wrist pronation    Wrist supination    (Blank rows = not tested)  UPPER EXTREMITY MMT:  MMT Right eval Left eval  Shoulder flexion    Shoulder extension    Shoulder abduction    Shoulder adduction    Shoulder internal rotation    Shoulder external rotation    Middle trapezius    Lower trapezius    Elbow flexion    Elbow extension    Wrist flexion 5 p! 5  Wrist extension 5 p!  5   Wrist ulnar deviation 5  5  Wrist radial deviation 5 p! 5  Wrist pronation    Wrist supination    Grip strength (lbs) 30 lbs 75 lb  (Blank rows = not tested)  SHOULDER SPECIAL TESTS:   JOINT MOBILITY TESTING:  WNL during palpation  PALPATION:  TTP R brachioradialis, extensor carpi radialis longus/brevis, highly tender and puffy along forearm flexor bundle                                                                                                                             TREATMENT DATE: 12/09/24 See HEP below   PATIENT EDUCATION: Education details: Exam findings, POC, ice and compression Person educated: Patient Education method: Explanation, Demonstration, and Handouts Education comprehension: verbalized understanding, returned demonstration, and needs further education  HOME EXERCISE PROGRAM: To be initiated  ASSESSMENT:  CLINICAL IMPRESSION: Patient is a 38 y.o. M who was seen today for physical therapy evaluation and treatment for R wrist pain. PMH is significant for MS and chronic R shoulder and L wrist injuries. Assessment is significant for good joint mobility in forearm/wrist; however, pt demos muscle tightness and swelling along his forearm (especially flexor bundle) affecting wrist AROM and grip strength. Pt will benefit from PT to address these issues and return to PLOF.   OBJECTIVE IMPAIRMENTS: decreased activity tolerance, decreased coordination, decreased endurance, decreased mobility, decreased ROM, decreased strength, increased edema, increased fascial restrictions, increased muscle spasms, impaired UE functional use, improper body mechanics, and pain.   ACTIVITY LIMITATIONS: carrying, lifting, bathing, toileting, dressing, self feeding, and hygiene/grooming  PARTICIPATION LIMITATIONS: meal prep, cleaning, laundry, driving, and occupation  PERSONAL FACTORS: Age, Fitness, Past/current experiences, and Time since onset of injury/illness/exacerbation are  also affecting patient's functional outcome.   REHAB POTENTIAL: Good  CLINICAL DECISION MAKING: Evolving/moderate complexity  EVALUATION COMPLEXITY: Moderate   GOALS: Goals reviewed with patient? Yes  SHORT TERM GOALS: Target date: 12/30/2024   Pt will be ind with initial HEP Baseline: Goal status: INITIAL  2.  Pt will demo full and pain free wrist AROM on R Baseline:  Goal status: INITIAL   LONG TERM GOALS: Target date: 01/20/2025   Pt will be ind with management and progression of HEP Baseline:  Goal status: INITIAL  2.  Pt will report improved pain by >/=50% Baseline:  Goal status: INITIAL  3.  Pt will have improved R grip strength >50 lbs Baseline:  Goal status: INITIAL  4.  Pt will demo 5/5 MMT without increased pain Baseline:  Goal status: INITIAL  5.  Pt will have improved quickDASH to </=48.6 to demo MCID Baseline:  Goal status: INITIAL    PLAN:  PT FREQUENCY: 1-2x/week  PT DURATION: 6 weeks  PLANNED INTERVENTIONS: 97164- PT Re-evaluation, 97750- Physical Performance Testing, 97110-Therapeutic exercises, 97530- Therapeutic activity, [id number]- Neuromuscular re-education, 97535- Self Care, 02859- Manual therapy, H9716- Electrical stimulation (unattended), 97016- Vasopneumatic device, [id number]- Ultrasound, [id number]- Ionotophoresis 4mg /ml Dexamethasone, 20560 (1-2  muscles), 20561 (3+ muscles)- Dry Needling, Patient/Family education, Taping, Joint mobilization, Cryotherapy, and Moist heat  PLAN FOR NEXT SESSION: How was icing, massage and compression for flexor forearm bundle? Ultrasound and/or e-stim flexor forearm and extensor forearm bundle. Consider ionto. Gentle forearm stretches. Initiate isometric wrist strengthening in pain free range   Nelma Phagan April Ma L Princetta Uplinger, PT 12/09/2024, 3:56 PM  "

## 2024-12-16 ENCOUNTER — Telehealth: Payer: Self-pay | Admitting: Neurology

## 2024-12-16 DIAGNOSIS — M25511 Pain in right shoulder: Secondary | ICD-10-CM | POA: Diagnosis not present

## 2024-12-16 DIAGNOSIS — M25512 Pain in left shoulder: Secondary | ICD-10-CM | POA: Diagnosis not present

## 2024-12-16 DIAGNOSIS — M25531 Pain in right wrist: Secondary | ICD-10-CM | POA: Diagnosis not present

## 2024-12-16 NOTE — Telephone Encounter (Signed)
 Pt called to request medication refill  baclofen  (LIORESAL ) 10 MG tablet  Vitamin D , Ergocalciferol , (DRISDOL ) 1.25 MG (50000 UNIT) CAPS capsule   Pt medication is to be sent to     [id number] - [place], [place] - [address] Phone: [phone]  Fax: [phone]

## 2024-12-19 ENCOUNTER — Ambulatory Visit: Payer: Self-pay | Attending: Family Medicine | Admitting: *Deleted

## 2024-12-19 DIAGNOSIS — M79631 Pain in right forearm: Secondary | ICD-10-CM | POA: Insufficient documentation

## 2024-12-19 DIAGNOSIS — M25511 Pain in right shoulder: Secondary | ICD-10-CM | POA: Insufficient documentation

## 2024-12-19 DIAGNOSIS — M25631 Stiffness of right wrist, not elsewhere classified: Secondary | ICD-10-CM | POA: Insufficient documentation

## 2024-12-19 DIAGNOSIS — R600 Localized edema: Secondary | ICD-10-CM | POA: Insufficient documentation

## 2024-12-19 DIAGNOSIS — M62838 Other muscle spasm: Secondary | ICD-10-CM | POA: Insufficient documentation

## 2024-12-19 DIAGNOSIS — G8929 Other chronic pain: Secondary | ICD-10-CM | POA: Insufficient documentation

## 2024-12-22 ENCOUNTER — Ambulatory Visit: Payer: Self-pay

## 2024-12-22 ENCOUNTER — Telehealth: Payer: Self-pay | Admitting: Neurology

## 2024-12-22 MED ORDER — ETODOLAC 400 MG PO TABS: 400.0000 mg | ORAL_TABLET | Freq: Two times a day (BID) | ORAL | 5 refills | Status: DC

## 2024-12-22 NOTE — Telephone Encounter (Signed)
 REFILL SENT

## 2024-12-22 NOTE — Telephone Encounter (Signed)
 Pt called to request medication refill  etodolac  (LODINE ) 400 MG tablet   Pt medication is to be sent to   [id number] - [place], [place] - [address] (Ph: [phone])

## 2024-12-23 ENCOUNTER — Ambulatory Visit

## 2024-12-24 ENCOUNTER — Other Ambulatory Visit: Payer: Self-pay

## 2024-12-24 MED ORDER — BACLOFEN 10 MG PO TABS: 10.0000 mg | ORAL_TABLET | Freq: Four times a day (QID) | ORAL | 0 refills | Status: AC

## 2024-12-24 MED ORDER — VITAMIN D (ERGOCALCIFEROL) 1.25 MG (50000 UNIT) PO CAPS: 50000.0000 [IU] | ORAL_CAPSULE | ORAL | 0 refills | Status: DC

## 2024-12-24 NOTE — Telephone Encounter (Signed)
 Pt Last Seen 09/16/24 No Upcoming Appointment   Baclofen  10mg  12/17/24  (90 day supply) Vitamin D  1.25mg  12/17/24 (14 day supply)  Sent to Pharmacy

## 2024-12-29 ENCOUNTER — Ambulatory Visit: Admitting: *Deleted

## 2024-12-29 DIAGNOSIS — M62838 Other muscle spasm: Secondary | ICD-10-CM | POA: Diagnosis present

## 2024-12-29 DIAGNOSIS — M79631 Pain in right forearm: Secondary | ICD-10-CM | POA: Diagnosis present

## 2024-12-29 DIAGNOSIS — G8929 Other chronic pain: Secondary | ICD-10-CM

## 2024-12-29 DIAGNOSIS — R600 Localized edema: Secondary | ICD-10-CM | POA: Diagnosis present

## 2024-12-29 DIAGNOSIS — M25631 Stiffness of right wrist, not elsewhere classified: Secondary | ICD-10-CM | POA: Diagnosis present

## 2024-12-29 DIAGNOSIS — M25511 Pain in right shoulder: Secondary | ICD-10-CM | POA: Diagnosis present

## 2024-12-29 NOTE — Therapy (Signed)
 " OUTPATIENT PHYSICAL THERAPY SHOULDER TREATMENT   Patient Name: Alvin Williams MRN: [id number] DOB:[birth year], 39 y.o., male Today's Date: 12/29/2024  END OF SESSION:  PT End of Session - 12/29/24 1116     Visit Number 2    Number of Visits 6    Date for Recertification  01/20/25    Authorization Type [employer]    PT Start Time 1116   15 mins late   PT Stop Time 1203    PT Time Calculation (min) 47 min          Past Medical History:  Diagnosis Date   MS (multiple sclerosis)    No past surgical history on file. Patient Active Problem List   Diagnosis Date Noted   Trigger little finger of left hand 03/20/2024   Vitamin D  deficiency 02/07/2024   Left wrist pain 02/07/2024   Encounter for monitoring immunomodulating therapy 11/12/2023   Gait disturbance 11/12/2023   Moderate episode of recurrent major depressive disorder (HCC) 08/07/2023   Routine medical exam 08/07/2023   Multiple sclerosis 07/11/2022   Chronic neuropathic pain 04/21/2020   General unsteadiness 04/21/2020    PCP: Deitra Morton Sebastian Nena, NP  REFERRING PROVIDER: Barbra Con PARAS, MD  REFERRING DIAG: [phone] (ICD-10-CM) - Pain in right wrist  THERAPY DIAG:  Pain in right forearm  Chronic right shoulder pain  Stiffness of right wrist, not elsewhere classified  Localized edema  Other muscle spasm  Rationale for Evaluation and Treatment: Rehabilitation  ONSET DATE: Reinjury ~2 months ago  SUBJECTIVE:                                                                                                                                                                                      SUBJECTIVE STATEMENT: Pt states RT arm was is still hurting 8/10  Hand dominance: Left  PERTINENT HISTORY: MS 11/10/24 office note had recommended wrist splint for wrist sprain  PAIN:  Are you having pain? Yes: NPRS scale: currently 8 at rest, 10/10 at worst  Pain location: R wrist (volar and  dorsal surface) and shoulder Pain description: Ache, sore, throbbing Aggravating factors: Using wrist without brace Relieving factors: Using splint/brace  PRECAUTIONS: None  RED FLAGS: None   WEIGHT BEARING RESTRICTIONS: No  FALLS:  Has patient fallen in last 6 months? 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  DIAGNOSTIC FINDINGS:  10/24/24 office visit Images: Three-view x-ray of the bilateral hands ordered and reviewed by me does not show any acute or degenerative bony abnormality.   PATIENT SURVEYS:  Quick Dash:  QUICK DASH  Please rate your ability do the following activities in the last week by selecting the number below the appropriate response.   Activities Rating  Open a tight or new jar.  4 = Severe difficulty  Do heavy household chores (e.g., wash walls, floors). 3 = Moderate difficulty  Carry a shopping bag or briefcase 4 = Severe difficulty  Wash your back. 4 = Severe difficulty  Use a knife to cut food. 3 = Moderate difficulty  Recreational activities in which you take some force or impact through your arm, shoulder or hand (e.g., golf, hammering, tennis, etc.). 4 = Severe difficulty  During the past week, to what extent has your arm, shoulder or hand problem interfered with your normal social activities with family, friends, neighbors or groups?  4 = Quite a bit  During the past week, were you limited in your work or other regular daily activities as a result of your arm, shoulder or hand problem? 3 = Moderately limited  Rate the severity of the following symptoms in the last week: Arm, Shoulder, or hand pain.  5 = Extreme  Rate the severity of the following symptoms in the last week: Tingling (pins and needles) in your arm, shoulder or hand. 1 = none  During the past week, how much difficulty have you had sleeping because of the pain in your arm, shoulder or hand?  4 = Severe difficulty  Score 63.6   (A QuickDASH score may not be calculated if there is greater than 1 missing item.)  Minimally Clinically Important Difference (MCID): 15-20 points  (Franchignoni, F. et al. (2013). Minimally clinically important difference of the disabilities of the arm, shoulder, and hand outcome measures (DASH) and its shortened version (Quick DASH). Journal of Orthopaedic & Sports Physical Therapy, 44(1), 30-39)   COGNITION: Overall cognitive status: Within functional limits for tasks assessed     SENSATION: WFL  POSTURE: Rounded shoulders  UPPER EXTREMITY ROM:   Active ROM Right eval Left eval  Shoulder flexion    Shoulder extension    Shoulder abduction    Shoulder adduction    Shoulder internal rotation    Shoulder external rotation    Elbow flexion    Elbow extension    Wrist flexion 55 75  Wrist extension 45 59  Wrist ulnar deviation 38 38  Wrist radial deviation 15 p! 15  Wrist pronation    Wrist supination    (Blank rows = not tested)  UPPER EXTREMITY MMT:  MMT Right eval Left eval  Shoulder flexion    Shoulder extension    Shoulder abduction    Shoulder adduction    Shoulder internal rotation    Shoulder external rotation    Middle trapezius    Lower trapezius    Elbow flexion    Elbow extension    Wrist flexion 5 p! 5  Wrist extension 5 p! 5  Wrist ulnar deviation 5  5  Wrist radial deviation 5 p! 5  Wrist pronation    Wrist supination    Grip strength (lbs) 30 lbs 75 lb  (Blank rows = not tested)  SHOULDER SPECIAL TESTS:   JOINT MOBILITY TESTING:  WNL during palpation  PALPATION:  TTP R brachioradialis, extensor carpi radialis  longus/brevis, highly tender and  puffy along forearm flexor bundle                                                                                                                             TREATMENT DATE: 12-29-24      RT forearm  US  combo 1.5 w/cm2 x 10 mins 3.49mhz to RT forearm Manual STW/ IASTM to RT forearm IFC x 15 mins to RT forearm with HMP   PATIENT EDUCATION: Education details: Exam findings, POC, ice and compression Person educated: Patient Education method: Explanation, Demonstration, and Handouts Education comprehension: verbalized understanding, returned demonstration, and needs further education  HOME EXERCISE PROGRAM: To be initiated  ASSESSMENT:  CLINICAL IMPRESSION: Patient arrived today doing about the same with RT forearm pain. Rx focused on US  combo, STW, and IFC to decrease pain and did fairly well. Light STW tolerated well.  OBJECTIVE IMPAIRMENTS: decreased activity tolerance, decreased coordination, decreased endurance, decreased mobility, decreased ROM, decreased strength, increased edema, increased fascial restrictions, increased muscle spasms, impaired UE functional use, improper body mechanics, and pain.   ACTIVITY LIMITATIONS: carrying, lifting, bathing, toileting, dressing, self feeding, and hygiene/grooming  PARTICIPATION LIMITATIONS: meal prep, cleaning, laundry, driving, and occupation  PERSONAL FACTORS: Age, Fitness, Past/current experiences, and Time since onset of injury/illness/exacerbation are also affecting patient's functional outcome.   REHAB POTENTIAL: Good  CLINICAL DECISION MAKING: Evolving/moderate complexity  EVALUATION COMPLEXITY: Moderate   GOALS: Goals reviewed with patient? Yes  SHORT TERM GOALS: Target date: 12/30/2024   Pt will be ind with initial HEP Baseline: Goal status: INITIAL  2.  Pt will demo full and pain free wrist AROM on R Baseline:  Goal status: INITIAL   LONG TERM GOALS: Target date: 01/20/2025   Pt will be ind with management and  progression of HEP Baseline:  Goal status: INITIAL  2.  Pt will report improved pain by >/=50% Baseline:  Goal status: INITIAL  3.  Pt will have improved R grip strength >50 lbs Baseline:  Goal status: INITIAL  4.  Pt will demo 5/5 MMT without increased pain Baseline:  Goal status: INITIAL  5.  Pt will have improved quickDASH to </=48.6 to demo MCID Baseline:  Goal status: INITIAL    PLAN:  PT FREQUENCY: 1-2x/week  PT DURATION: 6 weeks  PLANNED INTERVENTIONS: 97164- PT Re-evaluation, 97750- Physical Performance Testing, 97110-Therapeutic exercises, 97530- Therapeutic activity, [id number]- Neuromuscular re-education, 97535- Self Care, 02859- Manual therapy, G0283- Electrical stimulation (unattended), 97016- Vasopneumatic device, [id number]- Ultrasound, [id number]- Ionotophoresis 4mg /ml Dexamethasone, 79439 (1-2 muscles), 20561 (3+ muscles)- Dry Needling, Patient/Family education, Taping, Joint mobilization, Cryotherapy, and Moist heat  PLAN FOR NEXT SESSION: How was icing, massage and compression for flexor forearm bundle? Ultrasound and/or e-stim flexor forearm and extensor forearm bundle. Consider ionto. Gentle forearm stretches. Initiate isometric wrist strengthening in pain free range   Yanin Muhlestein,CHRIS, PTA 12/29/2024, 12:15 PM  "

## 2025-01-02 ENCOUNTER — Ambulatory Visit

## 2025-01-05 ENCOUNTER — Telehealth: Payer: Self-pay | Admitting: Neurology

## 2025-01-05 MED ORDER — GABAPENTIN 600 MG PO TABS: 600.0000 mg | ORAL_TABLET | Freq: Three times a day (TID) | ORAL | 5 refills | Status: AC

## 2025-01-05 MED ORDER — VITAMIN D (ERGOCALCIFEROL) 1.25 MG (50000 UNIT) PO CAPS: 50000.0000 [IU] | ORAL_CAPSULE | ORAL | 0 refills | Status: DC

## 2025-01-05 NOTE — Telephone Encounter (Signed)
 Pt called needing a refill on his gabapentin  (NEURONTIN ) 600 MG tablet and his Vitamin D , Ergocalciferol , (DRISDOL ) 1.25 MG (50000 UNIT) CAPS capsule and [facility] [place].

## 2025-01-05 NOTE — Telephone Encounter (Signed)
 refilled

## 2025-01-16 ENCOUNTER — Other Ambulatory Visit: Payer: Self-pay | Admitting: Neurology

## 2025-01-16 DIAGNOSIS — G35D Multiple sclerosis, unspecified: Secondary | ICD-10-CM

## 2025-01-21 ENCOUNTER — Ambulatory Visit: Payer: Self-pay | Admitting: Neurology

## 2025-01-21 ENCOUNTER — Encounter: Payer: Self-pay | Admitting: Neurology

## 2025-01-21 ENCOUNTER — Telehealth: Payer: Self-pay | Admitting: Neurology

## 2025-01-21 VITALS — BP 124/56 | HR 69 | Resp 15 | Ht 69.5 in | Wt 148.5 lb

## 2025-01-21 DIAGNOSIS — R269 Unspecified abnormalities of gait and mobility: Secondary | ICD-10-CM

## 2025-01-21 DIAGNOSIS — G35C1 Active secondary progressive multiple sclerosis: Secondary | ICD-10-CM

## 2025-01-21 DIAGNOSIS — R208 Other disturbances of skin sensation: Secondary | ICD-10-CM

## 2025-01-21 DIAGNOSIS — Z79899 Other long term (current) drug therapy: Secondary | ICD-10-CM

## 2025-01-21 DIAGNOSIS — R261 Paralytic gait: Secondary | ICD-10-CM

## 2025-01-21 NOTE — Telephone Encounter (Signed)
 Patient calling with update on insurance. Have renew insurance and all insurance information is the still the same.

## 2025-01-21 NOTE — Telephone Encounter (Signed)
 MRI orders sent to [facility] [phone] Right now he would be self-pay, but he says his medicaid is going to be active again.

## 2025-01-21 NOTE — Progress Notes (Signed)
 "  [facility]  PATIENT: Alvin Williams DOB: 05-07-1986  REFERRING DOCTOR OR PCP: Havery Rumble MD SOURCE: Patient, notes from Dr. Rumble, laboratory reports, MRI reports, MRI images personally reviewed.  _________________________________   HISTORICAL  CHIEF COMPLAINT:  Chief Complaint  Patient presents with   Multiple Sclerosis    Rm11, alone, Pt is here for a follow up on his ms he does report that his ms is worsening. Bilateral leg and feet weakness and shortness of breath, Cognitive decline, weight loss, sweating during episode. Pt is concerned with ability to keep job    HISTORY OF PRESENT ILLNESS:  Alvin Williams is a 39 y.o. man who was diagnosed with MS in 2020.    Update 01/21/2025: He switched to Ksimpta (was on Tysabri ) and tolerates it well.    He denies any exacerbations but legs are hurting more and are more stiff.  He had an episode at work when he felt hot and started sweating.  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   He felt poor and went home.  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  He does not know if he had a fever.  He felt better after he cooled down at home.   The next day was fairly normal.     Currently, his gait is off balanced.  He stumbles and had one fall in the shower.   .   He uses the bannister on stairs.  He has pain in both legs., left > right - muscle cramp like and burning.   He notes some leg numbness.   He notes spasticity in leg muscles, L>R.   He also notes milder arm pain and numbness   He will sometimes drop items.       He has urinary frequency but no incontinence.    He denies any issues with his vision currently.SABRA  He had nystagmus at the time of diagnosis.   He had an episode of vertigo/dizziness at work   He started staggering and fell on his way out.   He reports he was fired due to these symptoms.  He felt better later that  day.  He has bilateral leg pain, L>R, and worse in the back of the upper legs.   His legs are a little stiff.   He is on baclofen  10 mg po tid and gabapentin  600 mg po tid.  He is not sure if gabapentin  400 mg po tid has helped.  He tolerates it well.      Additionally, he notes reduced memory and focus.  He has some depression and anxiety.   He feels he sleeps well most nights.  No snoring.   He notes fatigue.     He is otherwise in good medical health.      . MS HISTORY In 2019 he had an episode of numbness and pain in the legs x 2 weeks.  One month later, he had symptoms in the left hand x 1 month.   A couple months later, while at work he noted vision was blurry when he would turn his head for a few seconds at a time and his vision was jerking.     He was referred to Dr, Rumble and then had MRI's performed that were consistent with MS.  SABRA   He was placed on Tysabri  and continues on it.  He has done well with no exacerbation.    He has been doing infusions at [facility].    Last infusion was 2 weeks ago.   By report, he is JCV positive and the Tysabri  was stretched to every 6 weeks.   He was on Tysabri  with a low positive titer (0.88 on 12/13/2022) every 6 weeks.     Last infusion was late January 2024.  We switched to [name]  in May 2024 but it took a few months to get started.   MRI in 12/2023 showed progression (likely occurred during 5-6 months while off medication).    Imaging:  MRi of the brain 01/04/2024 showed Multiple T2/FLAIR hyperintense foci in the cerebral hemispheres, spinal cord, cerebellum and thalamus in a pattern consistent with chronic demyelinating plaque associated with multiple sclerosis. None of the foci enhanced or appear to be acute. However, compared to the MRI from 2022 there were several new lesions in the cerebral hemispheres.   MRI of the brain 10/20/2021 showed multiple T2/FLAIR hyperintense foci in the cerebral hemispheres, predominantly in the periventricular  white matter radially oriented to the ventricles.  Foci also noted in the right cerebellar hemisphere, and right posterior pons.    MRI of the cervical spine 10/20/2021 showed T2 hyperintense foci within the spinal cord adjacent to C1-C2, adjacent to C3 centrally and towards the left, adjacent to C3 posterolaterally to the right (small), adjacent to C4-C5 posterolaterally to the right  MRI of the brain 05/16/2019 showed large enhancing lesions in the periventricular white matter of both posterior frontal/parietal lobes and another small T2 hyperintense foci in the periventricular white matter.    MRI of the cervical spine 05/16/2019 shows foci adjacent to C1-C2 and adjacent to C2-C3 (enhancing) and C5.  REVIEW OF SYSTEMS: Constitutional: No fevers, chills, sweats, or change in appetite.  He has fatigue.  He has insomnia. Eyes: No visual changes, double vision, eye pain Ear, nose and throat: No hearing loss, ear pain, nasal congestion, sore throat Cardiovascular: No chest pain, palpitations Respiratory:  No shortness of breath at rest or with exertion.   No wheezes GastrointestinaI: No nausea, vomiting, diarrhea, abdominal pain, fecal incontinence Genitourinary:  No dysuria, urinary retention or frequency.  No nocturia. Musculoskeletal: No neck pain.  He notes some back pain. Integumentary: No rash, pruritus, skin lesions Neurological: as above Psychiatric: No depression at this time.  No anxiety Endocrine: No palpitations, diaphoresis, change in appetite, change in weigh or increased thirst Hematologic/Lymphatic:  No anemia, purpura, petechiae. Allergic/Immunologic: No itchy/runny eyes, nasal congestion, recent allergic reactions, rashes  ALLERGIES: No Known Allergies  HOME MEDICATIONS:  Current Outpatient Medications:    baclofen  (LIORESAL ) 10 MG tablet, Take 1 tablet (10 mg total) by mouth 4 (four) times daily., Disp: 360 tablet, Rfl: 0   etodolac  (LODINE ) 400 MG tablet, Take 1 tablet  (400 mg total) by mouth 2 (two) times daily., Disp: 60 tablet, Rfl: 5   gabapentin  (NEURONTIN ) 600 MG tablet, Take 1 tablet (600 mg total) by mouth 3 (three) times daily., Disp: 90 tablet, Rfl: 5   [name]  20 MG/0.4ML SOAJ, INJECT 0.4 ML UNDER THE SKIN EVERY 30 DAYS, Disp: 0.4 mL, Rfl: 11   Vitamin D , Ergocalciferol , (DRISDOL ) 1.25 MG (50000 UNIT) CAPS capsule, Take 1 capsule (50,000 Units total) by mouth every 7 (seven) days., Disp: 13 capsule, Rfl: 0  PAST MEDICAL HISTORY: Past Medical History:  Diagnosis Date   MS (multiple sclerosis)     PAST SURGICAL HISTORY: History reviewed. No  pertinent surgical history.  FAMILY HISTORY: Family History  Problem Relation Age of Onset   Parkinson's disease Maternal Grandmother     SOCIAL HISTORY: Social History   Socioeconomic History   Marital status: Single    Spouse name: Not on file   Number of children: 6   Years of education: Not on file   Highest education level: Not on file  Occupational History   Not on file  Tobacco Use   Smoking status: Former    Types: Cigarettes   Smokeless tobacco: Never   Tobacco comments:    19 years cigarette free   Vaping Use   Vaping status: Never Used  Substance and Sexual Activity   Alcohol use: Never    Alcohol/week: 1.0 standard drink of alcohol    Types: 1 Standard drinks or equivalent per week   Drug use: Yes    Frequency: 5.0 times per week    Types: Marijuana   Sexual activity: Not Currently    Birth control/protection: None  Other Topics Concern   Not on file  Social History Narrative   Caffeine use: water and Gatorade only     Social Drivers of Health   Tobacco Use: Medium Risk (01/21/2025)   Patient History    Smoking Tobacco Use: Former    Smokeless Tobacco Use: Never    Passive Exposure: Not on Actuary Strain: Low Risk (08/07/2023)   Overall Financial Resource Strain (CARDIA)    Difficulty of Paying Living Expenses: Not hard at all  Food Insecurity: No  Food Insecurity (08/07/2023)   Hunger Vital Sign    Worried About Running Out of Food in the Last Year: Never true    Ran Out of Food in the Last Year: Never true  Transportation Needs: No Transportation Needs (08/07/2023)   PRAPARE - Administrator, Civil Service (Medical): No    Lack of Transportation (Non-Medical): No  Physical Activity: Inactive (02/07/2024)   Exercise Vital Sign    Days of Exercise per Week: 0 days    Minutes of Exercise per Session: 0 min  Stress: Not on file  Social Connections: Not on file  Intimate Partner Violence: Not At Risk (08/07/2023)   Humiliation, Afraid, Rape, and Kick questionnaire    Fear of Current or Ex-Partner: No    Emotionally Abused: No    Physically Abused: No    Sexually Abused: No  Depression (PHQ2-9): High Risk (08/07/2023)   Depression (PHQ2-9)    PHQ-2 Score: 15  Alcohol Screen: Low Risk (02/07/2024)   Alcohol Screen    Last Alcohol Screening Score (AUDIT): 0  Housing: Low Risk (08/07/2023)   Housing    Last Housing Risk Score: 0  Utilities: Not on file  Health Literacy: Not on file       PHYSICAL EXAM  Vitals:   01/21/25 0858 01/21/25 0902  BP: 123/80 (!) 124/56  Pulse:  69  Resp:  15  Weight:  148 lb 8 oz (67.4 kg)  Height:  5' 9.5 (1.765 m)    Body mass index is 21.62 kg/m.    General: The patient is well-developed and well-nourished and in no acute distress  HEENT:  Head is [place]/AT.  Sclera are anicteric.   Skin: Extremities are without rash or  edema.  Neurologic Exam  Mental status: The patient is alert and oriented x 3 at the time of the examination. The patient has apparent normal recent and remote memory, with an apparently normal attention  span and concentration ability.   Speech is normal.  Cranial nerves: Extraocular movements are full.  He has reduced visual acuity OS..  Colors are symmetric.    Facial symmetry is present. There is good facial sensation to soft touch bilaterally.Facial  strength is normal.  Trapezius and sternocleidomastoid strength is normal. No dysarthria is noted.  No obvious hearing deficits are noted.  Motor:  Muscle bulk is normal.   Tone is mildly increased in the legs.. Strength is  5 / 5 in the arms, 4+/5 in the iliopsoas muscles and 5/5 elsewhere in the legs  Sensory: Sensory testing  shows reduced temperature sensation on the left and reduced vibration sensation in right leg.    Coordination: Cerebellar testing reveals good finger-nose-finger and slightly reduced heel-to-shin bilaterally.  Gait and station: Station is normal.   Gait is mildly wide.  Tandem gait is moderately wide.  Romberg is borderline.  Reflexes: Deep tendon reflexes are symmetric and normal in the arms and increased at the knees with spread.  No ankle clonus.     DIAGNOSTIC DATA (LABS, IMAGING, TESTING) - I reviewed patient records, labs, notes, testing and imaging myself where available.  Lab Results  Component Value Date   WBC 5.6 09/16/2024   HGB 15.0 09/16/2024   HCT 45.7 09/16/2024   MCV 94 09/16/2024   PLT 232 09/16/2024      Component Value Date/Time   NA 138 08/07/2023 1036   K 4.7 08/07/2023 1036   CL 100 08/07/2023 1036   CO2 25 08/07/2023 1036   GLUCOSE 79 08/07/2023 1036   GLUCOSE 76 02/15/2022 0946   BUN 13 08/07/2023 1036   CREATININE 1.29 (H) 08/07/2023 1036   CALCIUM 10.1 08/07/2023 1036   PROT 6.9 08/07/2023 1036   ALBUMIN 4.7 08/07/2023 1036   AST 21 08/07/2023 1036   ALT 12 08/07/2023 1036   ALKPHOS 61 08/07/2023 1036   BILITOT 1.0 08/07/2023 1036   GFRNONAA >60 02/15/2022 0946       ASSESSMENT AND PLAN  Active secondary progressive multiple sclerosis - Plan: MR BRAIN W WO CONTRAST, MR CERVICAL SPINE W WO CONTRAST, IgG, IgA, IgM, CBC with Differential/Platelet  Gait disturbance - Plan: MR BRAIN W WO CONTRAST, MR CERVICAL SPINE W WO CONTRAST  High risk medication use - Plan: IgG, IgA, IgM, CBC with  Differential/Platelet  Dysesthesia  Spastic gait  Continue [name] .  Check labs.  Due to additional symptoms, we will also check MRI of the brain and cervical spine to determine if we need to switch his disease modifying therapy. He is experiencing more muscle spasticity and pain.  Continue baclofen  4 times a day and gabapentin  600 mg 3 times a day.  Alvin Williams is a 39 year old man with an active form of secondary progressive multiple sclerosis.  He was diagnosed in 2019.  He has significant impairments including ataxic gait, muscle spasticity and dysesthesias (neuropathic type pain).  His disability worsens in heat.  Impairments from his active form of secondary progressive multiple sclerosis are expected to slowly worsen over the ensuing years.  For the multiple sclerosis, he is currently on [name]  monthly injections with reasonable control.  However, due to insurance issues in 2024 he went 5 months without treatment and had an exacerbation and new lesions on his MRI.  Therefore, it is medically necessary that he continue to receive the [name]  therapy on a monthly basis.  It is also necessary for him to have follow-up with a neurologist at least  twice a year and MRI every 1 to 2 years to monitor his disease.   Additionally, due to the multiple lesions in the spinal cord and brain from the multiple sclerosis, he has spasticity and the dysesthesia/neuropathic pain.  He is on baclofen  and gabapentin  to help treat these.  These medications have greatly improved his symptoms and it is medically necessary that he continue these medications.  He reports legal issues and that he might be incarcerated shortly.  I am concerned that interruption of his neurologic care and medication could lead to worsening control of his MS and symptoms which could lead to additional irreversible neurologic impairments.  Rtc 6 months or sooner if new or worsening issues  45-minute office visit with the majority of the  time spent face-to-face for history and physical, discussion/counseling and decision-making.  Additional time with record review and documentation.   Suan Pyeatt A. Vear, MD, [facility] 01/21/2025, 4:43 PM Certified in Neurology, Clinical Neurophysiology, Sleep Medicine and Neuroimaging  [facility] [address] [place], [place] [address] [phone] "

## 2025-01-22 ENCOUNTER — Ambulatory Visit: Payer: Self-pay | Admitting: Neurology

## 2025-01-22 LAB — IGG, IGA, IGM
IgG (Immunoglobin G), Serum: 916 mg/dL (ref 603–1613)
IgM (Immunoglobulin M), Srm: 67 mg/dL (ref 20–172)
Immunoglobulin A, (IgA) QN, Serum: 172 mg/dL (ref 90–386)

## 2025-01-22 LAB — CBC WITH DIFFERENTIAL/PLATELET
Basophils Absolute: 0 10*3/uL (ref 0.0–0.2)
Basos: 0 %
EOS (ABSOLUTE): 0.1 10*3/uL (ref 0.0–0.4)
Eos: 1 %
Hematocrit: 46.3 % (ref 37.5–51.0)
Hemoglobin: 15.4 g/dL (ref 13.0–17.7)
Immature Grans (Abs): 0 10*3/uL (ref 0.0–0.1)
Immature Granulocytes: 0 %
Lymphocytes Absolute: 1.4 10*3/uL (ref 0.7–3.1)
Lymphs: 28 %
MCH: 30.4 pg (ref 26.6–33.0)
MCHC: 33.3 g/dL (ref 31.5–35.7)
MCV: 92 fL (ref 79–97)
Monocytes Absolute: 0.5 10*3/uL (ref 0.1–0.9)
Monocytes: 10 %
Neutrophils Absolute: 3.1 10*3/uL (ref 1.4–7.0)
Neutrophils: 61 %
Platelets: 244 10*3/uL (ref 150–450)
RBC: 5.06 x10E6/uL (ref 4.14–5.80)
RDW: 12.1 % (ref 11.6–15.4)
WBC: 5.1 10*3/uL (ref 3.4–10.8)

## 2025-01-22 NOTE — Telephone Encounter (Signed)
 Letter mailed as requested

## 2025-01-23 ENCOUNTER — Telehealth: Payer: Self-pay | Admitting: Neurology

## 2025-01-23 MED ORDER — ETODOLAC 400 MG PO TABS: 400.0000 mg | ORAL_TABLET | Freq: Two times a day (BID) | ORAL | 5 refills | Status: AC

## 2025-01-23 MED ORDER — VITAMIN D (ERGOCALCIFEROL) 1.25 MG (50000 UNIT) PO CAPS: 50000.0000 [IU] | ORAL_CAPSULE | ORAL | 0 refills | Status: AC

## 2025-01-23 NOTE — Telephone Encounter (Signed)
 Pt is requesting a refill for Vitamin D , Ergocalciferol , (DRISDOL ) 1.25 MG (50000 UNIT) CAPS capsule & etodolac  (LODINE ) 400 MG tablet.  Pharmacy: [facility] [phone]

## 2025-01-23 NOTE — Telephone Encounter (Signed)
 Refilled

## 2025-02-05 ENCOUNTER — Ambulatory Visit: Admitting: Neurology

## 2025-02-15 ENCOUNTER — Other Ambulatory Visit

## 2025-09-03 ENCOUNTER — Ambulatory Visit: Admitting: Neurology
